# Patient Record
Sex: Female | Born: 1970
Health system: Southern US, Community
[De-identification: ages and names within clinical notes are randomized; demographics above are authoritative.]

## PROBLEM LIST (undated history)

## (undated) DIAGNOSIS — E079 Disorder of thyroid, unspecified: Secondary | ICD-10-CM

## (undated) DIAGNOSIS — N946 Dysmenorrhea, unspecified: Secondary | ICD-10-CM

## (undated) DIAGNOSIS — I10 Essential (primary) hypertension: Secondary | ICD-10-CM

## (undated) DIAGNOSIS — E785 Hyperlipidemia, unspecified: Secondary | ICD-10-CM

## (undated) HISTORY — PX: WISDOM TOOTH EXTRACTION: SHX21

## (undated) HISTORY — DX: Hyperlipidemia, unspecified: E78.5

## (undated) HISTORY — DX: Essential (primary) hypertension: I10

## (undated) HISTORY — DX: Disorder of thyroid, unspecified: E07.9

## (undated) HISTORY — DX: Dysmenorrhea, unspecified: N94.6

---

## 2013-02-21 ENCOUNTER — Ambulatory Visit: Payer: Self-pay | Admitting: Family Medicine

## 2013-03-04 ENCOUNTER — Ambulatory Visit (INDEPENDENT_AMBULATORY_CARE_PROVIDER_SITE_OTHER): Payer: Self-pay | Admitting: Family Medicine

## 2013-03-04 ENCOUNTER — Encounter: Payer: Self-pay | Admitting: Family Medicine

## 2013-03-04 VITALS — BP 120/80 | HR 81 | Temp 98.3°F | Ht <= 58 in | Wt 148.8 lb

## 2013-03-04 DIAGNOSIS — Z0000 Encounter for general adult medical examination without abnormal findings: Secondary | ICD-10-CM | POA: Insufficient documentation

## 2013-03-04 DIAGNOSIS — E785 Hyperlipidemia, unspecified: Secondary | ICD-10-CM

## 2013-03-04 DIAGNOSIS — E039 Hypothyroidism, unspecified: Secondary | ICD-10-CM | POA: Insufficient documentation

## 2013-03-04 MED ORDER — SIMVASTATIN 20 MG PO TABS: 20.0000 mg | ORAL_TABLET | Freq: Every day | ORAL | Status: DC

## 2013-03-04 MED ORDER — LEVOTHYROXINE SODIUM 100 MCG PO TABS: 100.0000 ug | ORAL_TABLET | Freq: Every day | ORAL | Status: DC

## 2013-03-04 NOTE — Assessment & Plan Note (Signed)
Pt's PE WNL.  UTD on GYN.  Check labs.  Anticipatory guidance provided.  

## 2013-03-04 NOTE — Assessment & Plan Note (Signed)
New to provider.  Check labs.  Adjust meds prn.

## 2013-03-04 NOTE — Assessment & Plan Note (Signed)
New to provider, chronic for pt.  Tolerating statin w/out difficulty.  Check labs.  Adjust meds prn  

## 2013-03-04 NOTE — Progress Notes (Signed)
  Subjective:    Patient ID: Rebekah Knox, female    DOB: Aug 16, 1971, 42 y.o.   MRN: [id number]  HPI New to establish.  Previous MD- none since moving from [place]  GYN- Marcelino Duster ([facility])  Difficult hx to obtain due to language barrier. Simvastatin 20 mg Levothyroxine   Review of Systems Patient reports no vision/ hearing changes, adenopathy,fever, weight change,  persistant/recurrent hoarseness , swallowing issues, chest pain, palpitations, edema, persistant/recurrent cough, hemoptysis, dyspnea (rest/exertional/paroxysmal nocturnal), gastrointestinal bleeding (melena, rectal bleeding), abdominal pain, significant heartburn, bowel changes, GU symptoms (dysuria, hematuria, incontinence), Gyn symptoms (abnormal  bleeding, pain),  syncope, focal weakness, memory loss, numbness & tingling, skin/hair/nail changes, abnormal bruising or bleeding, anxiety, or depression.     Objective:   Physical Exam General Appearance:    Alert, cooperative, no distress, appears stated age  Head:    Normocephalic, without obvious abnormality, atraumatic  Eyes:    PERRL, conjunctiva/corneas clear, EOM's intact, fundi    benign, both eyes  Ears:    Normal TM's and external ear canals, both ears  Nose:   Nares normal, septum midline, mucosa normal, no drainage    or sinus tenderness  Throat:   Lips, mucosa, and tongue normal; teeth and gums normal  Neck:   Supple, symmetrical, trachea midline, no adenopathy;    Thyroid: no enlargement/tenderness/nodules  Back:     Symmetric, no curvature, ROM normal, no CVA tenderness  Lungs:     Clear to auscultation bilaterally, respirations unlabored  Chest Wall:    No tenderness or deformity   Heart:    Regular rate and rhythm, S1 and S2 normal, no murmur, rub   or gallop  Breast Exam:    Deferred to GYN  Abdomen:     Soft, non-tender, bowel sounds active all four quadrants,    no masses, no organomegaly  Genitalia:    Deferred to GYN  Rectal:     Extremities:   Extremities normal, atraumatic, no cyanosis or edema  Pulses:   2+ and symmetric all extremities  Skin:   Skin color, texture, turgor normal, no rashes or lesions  Lymph nodes:   Cervical, supraclavicular, and axillary nodes normal  Neurologic:   CNII-XII intact, normal strength, sensation and reflexes    throughout          Assessment & Plan:

## 2013-03-04 NOTE — Patient Instructions (Addendum)
We'll notify you of your lab results and make any changes if needed Call with any questions or concerns Welcome!  We're glad to have you!

## 2013-03-05 LAB — HEPATIC FUNCTION PANEL
AST: 20 U/L (ref 0–37)
Albumin: 3.4 g/dL — ABNORMAL LOW (ref 3.5–5.2)
Alkaline Phosphatase: 53 U/L (ref 39–117)
Bilirubin, Direct: 0 mg/dL (ref 0.0–0.3)

## 2013-03-05 LAB — CBC WITH DIFFERENTIAL/PLATELET
Basophils Absolute: 0.1 10*3/uL (ref 0.0–0.1)
Eosinophils Relative: 0.5 % (ref 0.0–5.0)
Monocytes Absolute: 0.5 10*3/uL (ref 0.1–1.0)
Monocytes Relative: 7.6 % (ref 3.0–12.0)
Neutrophils Relative %: 54.8 % (ref 43.0–77.0)
Platelets: 261 10*3/uL (ref 150.0–400.0)
RDW: 16.8 % — ABNORMAL HIGH (ref 11.5–14.6)
WBC: 6 10*3/uL (ref 4.5–10.5)

## 2013-03-05 LAB — LIPID PANEL
HDL: 58.2 mg/dL (ref >39.00)
LDL Cholesterol: 90 mg/dL (ref 0–99)
Total CHOL/HDL Ratio: 3
Triglycerides: 93 mg/dL (ref 0.0–149.0)

## 2013-03-05 LAB — BASIC METABOLIC PANEL
CO2: 26 mEq/L (ref 19–32)
GFR: 88.21 mL/min (ref >60.00)
Glucose, Bld: 98 mg/dL (ref 70–99)
Potassium: 3.6 mEq/L (ref 3.5–5.1)
Sodium: 138 mEq/L (ref 135–145)

## 2013-03-07 ENCOUNTER — Encounter: Payer: Self-pay | Admitting: *Deleted

## 2013-03-20 ENCOUNTER — Telehealth: Payer: Self-pay | Admitting: Family Medicine

## 2013-03-20 NOTE — Telephone Encounter (Signed)
Patient states that she received her lab results in the mail but wants to know if she should be taking anything or what the next step in her plan of care is. Please advise.

## 2013-03-21 NOTE — Telephone Encounter (Signed)
Spoke with the pt and her husband and they want to know if she is to continue on the meds she is taking.   Informed them that the pt's labs look great and she not to change anything.  So she's to continue on all her meds.  The pt and her husband understood and agreed.//AB/CMA

## 2014-08-19 ENCOUNTER — Ambulatory Visit (INDEPENDENT_AMBULATORY_CARE_PROVIDER_SITE_OTHER): Payer: BC Managed Care – PPO | Admitting: Internal Medicine

## 2014-08-19 ENCOUNTER — Ambulatory Visit (INDEPENDENT_AMBULATORY_CARE_PROVIDER_SITE_OTHER): Payer: BC Managed Care – PPO

## 2014-08-19 ENCOUNTER — Encounter: Payer: Self-pay | Admitting: Internal Medicine

## 2014-08-19 VITALS — BP 130/88 | HR 72 | Temp 97.9°F | Resp 10 | Ht <= 58 in | Wt 148.0 lb

## 2014-08-19 DIAGNOSIS — Z23 Encounter for immunization: Secondary | ICD-10-CM

## 2014-08-19 DIAGNOSIS — E039 Hypothyroidism, unspecified: Secondary | ICD-10-CM

## 2014-08-19 DIAGNOSIS — E785 Hyperlipidemia, unspecified: Secondary | ICD-10-CM | POA: Insufficient documentation

## 2014-08-19 DIAGNOSIS — Z833 Family history of diabetes mellitus: Secondary | ICD-10-CM

## 2014-08-19 NOTE — Progress Notes (Signed)
Patient ID: Rebekah Knox, female   DOB: 26-Jan-1971, 43 y.o.   MRN: [id number]    Chief Complaint  Patient presents with  . Establish Care    New patient establish care: Fasting for any labs due,  . Vaginal Itching    Patient with vaginal itching prior to menstral cycle    Allergies  Allergen Reactions  . Ibuprofen Swelling   HPI 43 y/o female pt is here to establish care. She mentions having history of hypothyroidism and takes levothyroxine. She also has hyperlipidemia and is on lipitor. She is compliant with her medications. She complaints of occassional vaginal itching and discharge prior to her menses, recent completion of course of flagyl. No prior gyn records for review.  Review of Systems  Constitutional: Negative for fever, chills, weight loss, malaise/fatigue and diaphoresis.  HENT: Negative for congestion, hearing loss and sore throat.   Eyes: Negative for blurred vision, double vision and discharge. last eye exam 2 years back Respiratory: Negative for cough, sputum production, shortness of breath and wheezing.   Cardiovascular: Negative for chest pain, palpitations, orthopnea and leg swelling.  Gastrointestinal: Negative for heartburn, nausea, vomiting, abdominal pain, diarrhea and constipation.  Genitourinary: Negative for dysuria, urgency, frequency and flank pain. has vaginal itching with discharge prior to menstural cycle. Was seen by Gyn at [facility] 1 month back and had pap smear which was normal. Was treated for ? trichomonas Musculoskeletal: Negative for back pain, falls, joint pain and myalgias.  Skin: Negative for itching and rash. has dry skin Neurological: Negative for dizziness, tingling, focal weakness and headaches.  Psychiatric/Behavioral: Negative for depression and memory loss. The patient is not nervous/anxious.    Past Medical History  Diagnosis Date  . Thyroid disease   . Hyperlipidemia    No past surgical history on file. Current Outpatient  Prescriptions on File Prior to Visit  Medication Sig Dispense Refill  . levothyroxine (SYNTHROID, LEVOTHROID) 100 MCG tablet Take 1 tablet (100 mcg total) by mouth daily. 90 tablet 3  . simvastatin (ZOCOR) 20 MG tablet Take 1 tablet (20 mg total) by mouth at bedtime. 90 tablet 3   No current facility-administered medications on file prior to visit.    Family History  Problem Relation Age of Onset  . Cancer Mother     lung  . Heart attack Father    History   Social History  . Marital Status: Married    Spouse Name: N/A    Number of Children: N/A  . Years of Education: N/A   Occupational History  . Not on file.   Social History Main Topics  . Smoking status: Never Smoker   . Smokeless tobacco: Not on file  . Alcohol Use: No  . Drug Use: No  . Sexual Activity: Not on file   Other Topics Concern  . Not on file   Social History Narrative   Married since 1995   Lives in house, two stories, 6 persons live in home, no pets   No exercise       Physical exam BP 130/88 mmHg  Pulse 72  Temp(Src) 97.9 F (36.6 C) (Oral)  Resp 10  Ht 4' 8.5" (1.435 m)  Wt 148 lb (67.132 kg)  BMI 32.60 kg/m2  SpO2 98%  Wt Readings from Last 3 Encounters:  08/19/14 148 lb (67.132 kg)  03/04/13 148 lb 12.8 oz (67.495 kg)   General- adult female in no acute distress Head- atraumatic, normocephalic Eyes- PERRLA, EOMI, no pallor, no icterus,  no discharge Neck- no lymphadenopathy, no thyromegaly Mouth- normal mucus membrane Cardiovascular- normal s1,s2, no murmurs/ rubs/ gallops, normal distal pulses Respiratory- bilateral clear to auscultation, no wheeze, no rhonchi, no crackles Abdomen- bowel sounds present, soft, non tender, no CVA tenderness Musculoskeletal- able to move all 4 extremities, no leg edema Neurological- no focal deficit Skin- warm and dry Psychiatry- alert and oriented to person, place and time, normal mood and affect   Lab Results  Component Value Date   TSH 2.75  03/04/2013   Lipid Panel     Component Value Date/Time   CHOL 167 03/04/2013 1636   TRIG 93.0 03/04/2013 1636   HDL 58.20 03/04/2013 1636   CHOLHDL 3 03/04/2013 1636   VLDL 18.6 03/04/2013 1636   LDLCALC 90 03/04/2013 1636   CBC    Component Value Date/Time   WBC 6.0 03/04/2013 1636   RBC 4.70 03/04/2013 1636   HGB 12.2 03/04/2013 1636   HCT 36.8 03/04/2013 1636   PLT 261.0 03/04/2013 1636   MCV 78.4 03/04/2013 1636   MCHC 33.2 03/04/2013 1636   RDW 16.8* 03/04/2013 1636   LYMPHSABS 2.2 03/04/2013 1636   MONOABS 0.5 03/04/2013 1636   EOSABS 0.0 03/04/2013 1636   BASOSABS 0.1 03/04/2013 1636    CMP     Component Value Date/Time   NA 138 03/04/2013 1636   K 3.6 03/04/2013 1636   CL 108 03/04/2013 1636   CO2 26 03/04/2013 1636   GLUCOSE 98 03/04/2013 1636   BUN 7 03/04/2013 1636   CREATININE 0.8 03/04/2013 1636   CALCIUM 8.8 03/04/2013 1636   PROT 7.5 03/04/2013 1636   ALBUMIN 3.4* 03/04/2013 1636   AST 20 03/04/2013 1636   ALT 18 03/04/2013 1636   ALKPHOS 53 03/04/2013 1636   BILITOT 0.2* 03/04/2013 1636    Assessment/plan  1. Hypothyroidism, unspecified hypothyroidism type Continue levothyroxine 100 mcg daily for now, check thyroid panel and adjust dose if needed - CMP - CBC with Differential - TSH - T4, Free - T3  2. Hyperlipidemia Check lipid panel, continue lipitor current regimen, dietary counselling provided - Lipid Panel - CBC with Differential  3. Family history of diabetes mellitus in father Check a1c for diabetes. Diet and exercise counselling provided - CBC with Differential - Hemoglobin A1c   Influenza vaccine provided today Will need records from her gyn office for further review. Medical release form to be signed

## 2014-08-20 LAB — HEMOGLOBIN A1C
Est. average glucose Bld gHb Est-mCnc: 123 mg/dL
HEMOGLOBIN A1C: 5.9 % — AB (ref 4.8–5.6)

## 2014-08-20 LAB — COMPREHENSIVE METABOLIC PANEL
A/G RATIO: 1.3 (ref 1.1–2.5)
ALBUMIN: 3.9 g/dL (ref 3.5–5.5)
ALT: 23 IU/L (ref 0–32)
AST: 17 IU/L (ref 0–40)
Alkaline Phosphatase: 73 IU/L (ref 39–117)
BUN/Creatinine Ratio: 11 (ref 9–23)
BUN: 7 mg/dL (ref 6–24)
CALCIUM: 9.6 mg/dL (ref 8.7–10.2)
CO2: 23 mmol/L (ref 18–29)
CREATININE: 0.65 mg/dL (ref 0.57–1.00)
Chloride: 104 mmol/L (ref 97–108)
GFR calc Af Amer: 128 mL/min/{1.73_m2} (ref >59)
GFR, EST NON AFRICAN AMERICAN: 111 mL/min/{1.73_m2} (ref >59)
GLOBULIN, TOTAL: 3 g/dL (ref 1.5–4.5)
Glucose: 91 mg/dL (ref 65–99)
Potassium: 5.2 mmol/L (ref 3.5–5.2)
SODIUM: 139 mmol/L (ref 134–144)
TOTAL PROTEIN: 6.9 g/dL (ref 6.0–8.5)
Total Bilirubin: 0.4 mg/dL (ref 0.0–1.2)

## 2014-08-20 LAB — T3: T3, Total: 138 ng/dL (ref 71–180)

## 2014-08-20 LAB — CBC WITH DIFFERENTIAL/PLATELET
BASOS: 0 %
Basophils Absolute: 0 10*3/uL (ref 0.0–0.2)
EOS ABS: 0 10*3/uL (ref 0.0–0.4)
EOS: 1 %
HCT: 38.4 % (ref 34.0–46.6)
Hemoglobin: 13 g/dL (ref 11.1–15.9)
IMMATURE GRANS (ABS): 0 10*3/uL (ref 0.0–0.1)
IMMATURE GRANULOCYTES: 0 %
LYMPHS: 39 %
Lymphocytes Absolute: 1.9 10*3/uL (ref 0.7–3.1)
MCH: 27.1 pg (ref 26.6–33.0)
MCHC: 33.9 g/dL (ref 31.5–35.7)
MCV: 80 fL (ref 79–97)
MONOS ABS: 0.5 10*3/uL (ref 0.1–0.9)
Monocytes: 10 %
NEUTROS PCT: 50 %
Neutrophils Absolute: 2.5 10*3/uL (ref 1.4–7.0)
RBC: 4.8 x10E6/uL (ref 3.77–5.28)
RDW: 13.8 % (ref 12.3–15.4)
WBC: 4.9 10*3/uL (ref 3.4–10.8)

## 2014-08-20 LAB — LIPID PANEL
CHOL/HDL RATIO: 3.9 ratio (ref 0.0–4.4)
Cholesterol, Total: 238 mg/dL — ABNORMAL HIGH (ref 100–199)
HDL: 61 mg/dL (ref >39)
LDL Calculated: 148 mg/dL — ABNORMAL HIGH (ref 0–99)
Triglycerides: 143 mg/dL (ref 0–149)
VLDL Cholesterol Cal: 29 mg/dL (ref 5–40)

## 2014-08-20 LAB — T4, FREE: Free T4: 1.21 ng/dL (ref 0.82–1.77)

## 2014-08-20 LAB — TSH: TSH: 2.11 u[IU]/mL (ref 0.450–4.500)

## 2014-09-09 ENCOUNTER — Ambulatory Visit (INDEPENDENT_AMBULATORY_CARE_PROVIDER_SITE_OTHER): Payer: No Typology Code available for payment source | Admitting: Internal Medicine

## 2014-09-09 ENCOUNTER — Encounter: Payer: Self-pay | Admitting: Internal Medicine

## 2014-09-09 VITALS — BP 136/78 | HR 75 | Temp 98.2°F | Resp 18 | Ht <= 58 in | Wt 150.0 lb

## 2014-09-09 DIAGNOSIS — Z0000 Encounter for general adult medical examination without abnormal findings: Secondary | ICD-10-CM

## 2014-09-09 DIAGNOSIS — E039 Hypothyroidism, unspecified: Secondary | ICD-10-CM

## 2014-09-09 DIAGNOSIS — E785 Hyperlipidemia, unspecified: Secondary | ICD-10-CM

## 2014-09-09 DIAGNOSIS — R7309 Other abnormal glucose: Secondary | ICD-10-CM

## 2014-09-09 DIAGNOSIS — E669 Obesity, unspecified: Secondary | ICD-10-CM

## 2014-09-09 DIAGNOSIS — R7303 Prediabetes: Secondary | ICD-10-CM

## 2014-09-09 NOTE — Progress Notes (Signed)
Patient ID: Rebekah Knox, female   DOB: 12-11-1970, 44 y.o.   MRN: [id number]    Chief Complaint  Patient presents with  . Annual Exam   Allergies  Allergen Reactions  . Ibuprofen Swelling   HPI 44 y/o female pt is here for her annual exam.  Hypothyroidism- stable. taking levothyroxine. reviewed thyroid panel  Hyperlipidemia- has not been taking lipitor 20 mg daily for now, was taking it on as needed basis only, mentions she forgot to mentions it last visit. Reviewed recent lipid panel  Reviewed her other labs.  She complaints of occassional vaginal itching- says improved than before but persists. Has seen Gyn before. No prior gyn records yet for review.recent pap smear and pelvic exam by her gyn  Has sedentary lifestyle.    Review of Systems  Constitutional: Negative for fever, chills, weight loss, malaise/fatigue and diaphoresis.  HENT: Negative for congestion, hearing loss and sore throat.   Eyes: Negative for blurred vision, double vision and discharge. last eye exam 2 years back Respiratory: Negative for cough, sputum production, shortness of breath and wheezing.   Cardiovascular: Negative for chest pain, palpitations, orthopnea and leg swelling.  Gastrointestinal: Negative for heartburn, nausea, vomiting, abdominal pain, diarrhea and constipation.  Genitourinary: Negative for dysuria, urgency, frequency and flank pain.  Musculoskeletal: Negative for back pain, falls, joint pain and myalgias.  Skin: Negative for itching and rash. has dry skin Neurological: Negative for dizziness, tingling, focal weakness and headaches.  Psychiatric/Behavioral: Negative for depression and memory loss. The patient is not nervous/anxious.   Immunization History  Administered Date(s) Administered  . Influenza,inj,Quad PF,36+ Mos 08/19/2014   Past Medical History  Diagnosis Date  . Thyroid disease   . Hyperlipidemia    History reviewed. No pertinent past surgical history.  Family  History  Problem Relation Age of Onset  . Cancer Mother     lung  . Heart attack Father    History   Social History  . Marital Status: Married    Spouse Name: N/A    Number of Children: N/A  . Years of Education: N/A   Occupational History  . Not on file.   Social History Main Topics  . Smoking status: Never Smoker   . Smokeless tobacco: Not on file  . Alcohol Use: No  . Drug Use: No  . Sexual Activity: Not on file   Other Topics Concern  . Not on file   Social History Narrative   Married since 1995   Lives in house, two stories, 6 persons live in home, no pets   No exercise       Physical exam BP 136/78 mmHg  Pulse 75  Temp(Src) 98.2 F (36.8 C) (Oral)  Resp 18  Ht 4' 8.5" (1.435 m)  Wt 150 lb (68.04 kg)  BMI 33.04 kg/m2  SpO2 98%  LMP 09/01/2014 (Approximate)  Wt Readings from Last 3 Encounters:  09/09/14 150 lb (68.04 kg)  08/19/14 148 lb (67.132 kg)  03/04/13 148 lb 12.8 oz (67.495 kg)   General- adult female in no acute distress, obese Head- atraumatic, normocephalic Eyes- PERRLA, EOMI, no pallor, no icterus, no discharge Ears- left ear normal tympanic membrane and normal external ear canal , right ear normal tympanic membrane and normal external ear canal Neck- no lymphadenopathy, no thyromegaly, no jugular vein distension, no carotid bruit Nose- normal nasal mucosa, no maxillary sinus tenderness, no frontal sinus tenderness Mouth- normal mucus membrane, no oral thrush, normal oropharynx Chest- no chest wall deformities,  no chest wall tenderness Breast- no masses, no palpable lumps, normal nipple and areola exam, no axillary lymphadenopathy Cardiovascular- normal s1,s2, no murmurs/ rubs/ gallops, normal distal pulses Respiratory- bilateral clear to auscultation, no wheeze, no rhonchi, no crackles Abdomen- bowel sounds present, soft, non tender, no organomegaly, no abdominal bruits, no guarding or rigidity, no CVA tenderness Pelvic exam-  deferred Musculoskeletal- able to move all 4 extremities, no spinal and paraspinal tenderness, steady gait, no use of assistive device, normal range of motion, no leg edema Neurological- no focal deficit, normal reflexes, normal muscle strength, normal sensation to fine touch and vibration Skin- warm and dry Psychiatry- alert and oriented to person, place and time, normal mood and affect   Labs CBC Latest Ref Rng 08/19/2014 03/04/2013  WBC 3.4 - 10.8 x10E3/uL 4.9 6.0  Hemoglobin 11.1 - 15.9 g/dL 13.0 12.2  Hematocrit 34.0 - 46.6 % 38.4 36.8  Platelets 150.0 - 400.0 K/uL - 261.0    CMP Latest Ref Rng 08/19/2014 03/04/2013  Glucose 65 - 99 mg/dL 91 98  BUN 6 - 24 mg/dL 7 7  Creatinine 0.57 - 1.00 mg/dL 0.65 0.8  Sodium 134 - 144 mmol/L 139 138  Potassium 3.5 - 5.2 mmol/L 5.2 3.6  Chloride 97 - 108 mmol/L 104 108  CO2 18 - 29 mmol/L 23 26  Calcium 8.7 - 10.2 mg/dL 9.6 8.8  Total Protein 6.0 - 8.5 g/dL 6.9 7.5  Albumin 3.5 - 5.5 g/dL 3.9 -  Total Bilirubin 0.0 - 1.2 mg/dL 0.4 0.2(L)  Alkaline Phos 39 - 117 IU/L 73 53  AST 0 - 40 IU/L 17 20  ALT 0 - 32 IU/L 23 18    Lab Results  Component Value Date   HGBA1C 5.9* 08/19/2014   Lipid Panel     Component Value Date/Time   CHOL 167 03/04/2013 1636   TRIG 143 08/19/2014 1122   HDL 61 08/19/2014 1122   HDL 58.20 03/04/2013 1636   CHOLHDL 3.9 08/19/2014 1122   CHOLHDL 3 03/04/2013 1636   VLDL 18.6 03/04/2013 1636   LDLCALC 148* 08/19/2014 1122   LDLCALC 90 03/04/2013 1636   Lab Results  Component Value Date   TSH 2.110 08/19/2014    Assessment/plan  1. Prediabetes Has family history of diabetes, has high LDL and is obese by BMI and a1c is suggestive of prediabetes. counselled on dietary changes and exercise for atleast 30 min 5 days a week. Restarted on statin.  - Hemoglobin A1c; Future  2. Hyperlipidemia Resume zocor and monitor lipid panel - Lipid Panel; Future  3. Hypothyroidism, unspecified hypothyroidism  type Continue levothyroxine 100 mcg daily - TSH; Future  4. Routine physical exam the patient was counseled regarding the appropriate use of alcohol, regular self-examination of the breasts on a monthly basis, prevention of dental and periodontal disease, diet, regular sustained exercise for at least 30 minutes 5 times per week, routine screening interval for mammogram as recommended by the American Cancer Society and USPSTF, the proper use of sunscreen and protective clothing with cholesterol, thyroid and diabetes screening. uptodate with influenza vaccine.  5. Obesity (BMI 30.0-34.9) Advised on weight loss, explained benefits of it and complications that would arise from obesity. Calorie counting and eating healthy and daily exercise encouraged.

## 2014-09-12 DIAGNOSIS — Z0000 Encounter for general adult medical examination without abnormal findings: Secondary | ICD-10-CM | POA: Insufficient documentation

## 2014-09-12 DIAGNOSIS — E039 Hypothyroidism, unspecified: Secondary | ICD-10-CM | POA: Insufficient documentation

## 2014-09-12 DIAGNOSIS — E669 Obesity, unspecified: Secondary | ICD-10-CM | POA: Insufficient documentation

## 2014-09-12 DIAGNOSIS — R7303 Prediabetes: Secondary | ICD-10-CM | POA: Insufficient documentation

## 2014-10-14 ENCOUNTER — Encounter: Payer: Self-pay | Admitting: Internal Medicine

## 2015-01-26 ENCOUNTER — Encounter: Payer: Self-pay | Admitting: Internal Medicine

## 2015-02-10 ENCOUNTER — Telehealth: Payer: Self-pay | Admitting: Behavioral Health

## 2015-02-10 ENCOUNTER — Encounter: Payer: Self-pay | Admitting: Behavioral Health

## 2015-02-10 NOTE — Telephone Encounter (Signed)
Pre-Visit Call completed with patient and chart updated.   Pre-Visit Info documented in Specialty Comments under [employer].    

## 2015-02-11 ENCOUNTER — Encounter: Payer: Self-pay | Admitting: Physician Assistant

## 2015-02-11 ENCOUNTER — Ambulatory Visit (HOSPITAL_BASED_OUTPATIENT_CLINIC_OR_DEPARTMENT_OTHER)
Admission: RE | Admit: 2015-02-11 | Discharge: 2015-02-11 | Disposition: A | Discharge status: Home / Self Care | Payer: No Typology Code available for payment source | Source: Ambulatory Visit | Attending: Physician Assistant | Admitting: Physician Assistant

## 2015-02-11 ENCOUNTER — Telehealth: Payer: Self-pay

## 2015-02-11 ENCOUNTER — Ambulatory Visit (INDEPENDENT_AMBULATORY_CARE_PROVIDER_SITE_OTHER): Payer: No Typology Code available for payment source | Admitting: Physician Assistant

## 2015-02-11 VITALS — BP 122/70 | HR 72 | Temp 97.8°F | Resp 18 | Ht 58.5 in | Wt 148.8 lb

## 2015-02-11 DIAGNOSIS — G8929 Other chronic pain: Secondary | ICD-10-CM

## 2015-02-11 DIAGNOSIS — L309 Dermatitis, unspecified: Secondary | ICD-10-CM | POA: Insufficient documentation

## 2015-02-11 DIAGNOSIS — Z418 Encounter for other procedures for purposes other than remedying health state: Secondary | ICD-10-CM

## 2015-02-11 DIAGNOSIS — M25532 Pain in left wrist: Secondary | ICD-10-CM | POA: Insufficient documentation

## 2015-02-11 DIAGNOSIS — Z1239 Encounter for other screening for malignant neoplasm of breast: Secondary | ICD-10-CM | POA: Insufficient documentation

## 2015-02-11 DIAGNOSIS — M722 Plantar fascial fibromatosis: Secondary | ICD-10-CM | POA: Insufficient documentation

## 2015-02-11 DIAGNOSIS — E039 Hypothyroidism, unspecified: Secondary | ICD-10-CM

## 2015-02-11 DIAGNOSIS — M25531 Pain in right wrist: Secondary | ICD-10-CM

## 2015-02-11 DIAGNOSIS — M25539 Pain in unspecified wrist: Secondary | ICD-10-CM

## 2015-02-11 DIAGNOSIS — E785 Hyperlipidemia, unspecified: Secondary | ICD-10-CM | POA: Diagnosis not present

## 2015-02-11 DIAGNOSIS — Z299 Encounter for prophylactic measures, unspecified: Secondary | ICD-10-CM

## 2015-02-11 LAB — CBC
HCT: 39.1 % (ref 36.0–46.0)
HEMOGLOBIN: 12.8 g/dL (ref 12.0–15.0)
MCHC: 32.6 g/dL (ref 30.0–36.0)
MCV: 78.1 fl (ref 78.0–100.0)
Platelets: 276 10*3/uL (ref 150.0–400.0)
RBC: 5.01 Mil/uL (ref 3.87–5.11)
RDW: 14.8 % (ref 11.5–15.5)
WBC: 4.9 10*3/uL (ref 4.0–10.5)

## 2015-02-11 LAB — BASIC METABOLIC PANEL
BUN: 8 mg/dL (ref 6–23)
CALCIUM: 9 mg/dL (ref 8.4–10.5)
CO2: 22 mEq/L (ref 19–32)
CREATININE: 0.7 mg/dL (ref 0.40–1.20)
Chloride: 105 mEq/L (ref 96–112)
GFR: 97.52 mL/min (ref >60.00)
GLUCOSE: 89 mg/dL (ref 70–99)
Potassium: 3.7 mEq/L (ref 3.5–5.1)
Sodium: 136 mEq/L (ref 135–145)

## 2015-02-11 LAB — TSH: TSH: 2.95 u[IU]/mL (ref 0.35–4.50)

## 2015-02-11 LAB — LIPID PANEL
CHOL/HDL RATIO: 3
Cholesterol: 194 mg/dL (ref 0–200)
HDL: 59 mg/dL (ref >39.00)
LDL Cholesterol: 116 mg/dL — ABNORMAL HIGH (ref 0–99)
NonHDL: 135
Triglycerides: 97 mg/dL (ref 0.0–149.0)
VLDL: 19.4 mg/dL (ref 0.0–40.0)

## 2015-02-11 LAB — URINALYSIS, ROUTINE W REFLEX MICROSCOPIC
BILIRUBIN URINE: NEGATIVE
Hgb urine dipstick: NEGATIVE
Ketones, ur: NEGATIVE
Nitrite: NEGATIVE
PH: 6 (ref 5.0–8.0)
RBC / HPF: NONE SEEN (ref 0–?)
Specific Gravity, Urine: <=1.005 — AB (ref 1.000–1.030)
Total Protein, Urine: NEGATIVE
Urine Glucose: NEGATIVE
Urobilinogen, UA: 0.2 (ref 0.0–1.0)

## 2015-02-11 MED ORDER — SIMVASTATIN 20 MG PO TABS: 20.0000 mg | ORAL_TABLET | Freq: Every day | ORAL | Status: DC

## 2015-02-11 MED ORDER — LEVOTHYROXINE SODIUM 100 MCG PO TABS: 100.0000 ug | ORAL_TABLET | Freq: Every day | ORAL | Status: DC

## 2015-02-11 MED ORDER — TRIAMCINOLONE 0.1 % CREAM:EUCERIN CREAM 1:1
1.0000 | TOPICAL_CREAM | Freq: Two times a day (BID) | CUTANEOUS | Status: DC | PRN
Start: 2015-02-11 — End: 2015-02-17

## 2015-02-11 NOTE — Assessment & Plan Note (Signed)
Allergic to NSAIDs. Tylenol ES daily. Supportive foot wear.  Compression encouraged and ACE wrap given to patient. "cold can" exercises.  Follow-up if not improving.

## 2015-02-11 NOTE — Patient Instructions (Signed)
Please continue medications as directed.  Start the Kenalog: Eucerin cream twice daily for dryness and itching.  Continue allergy medication.  For the Plantar fasciitis, please take 2 ES tylenol as needed for pain.  Wear supportive footwear.  USe the ACE wrap to add compression.  Do the "cold can" exercises we discussed.  Read information below on this.  Go to lab for blood work.  Then go downstairs for imaging.  I will call you with your results.  Follow-up will be based on your results.  Plantar Fasciitis (Heel Spur Syndrome) with Rehab The plantar fascia is a fibrous, ligament-like, soft-tissue structure that spans the bottom of the foot. Plantar fasciitis is a condition that causes pain in the foot due to inflammation of the tissue. SYMPTOMS   Pain and tenderness on the underneath side of the foot.  Pain that worsens with standing or walking. CAUSES  Plantar fasciitis is caused by irritation and injury to the plantar fascia on the underneath side of the foot. Common mechanisms of injury include:  Direct trauma to bottom of the foot.  Damage to a small nerve that runs under the foot where the main fascia attaches to the heel bone.  Stress placed on the plantar fascia due to bone spurs. RISK INCREASES WITH:   Activities that place stress on the plantar fascia (running, jumping, pivoting, or cutting).  Poor strength and flexibility.  Improperly fitted shoes.  Tight calf muscles.  Flat feet.  Failure to warm-up properly before activity.  Obesity. PREVENTION  Warm up and stretch properly before activity.  Allow for adequate recovery between workouts.  Maintain physical fitness:  Strength, flexibility, and endurance.  Cardiovascular fitness.  Maintain a health body weight.  Avoid stress on the plantar fascia.  Wear properly fitted shoes, including arch supports for individuals who have flat feet. PROGNOSIS  If treated properly, then the symptoms of plantar  fasciitis usually resolve without surgery. However, occasionally surgery is necessary. RELATED COMPLICATIONS   Recurrent symptoms that may result in a chronic condition.  Problems of the lower back that are caused by compensating for the injury, such as limping.  Pain or weakness of the foot during push-off following surgery.  Chronic inflammation, scarring, and partial or complete fascia tear, occurring more often from repeated injections. TREATMENT  Treatment initially involves the use of ice and medication to help reduce pain and inflammation. The use of strengthening and stretching exercises may help reduce pain with activity, especially stretches of the Achilles tendon. These exercises may be performed at home or with a therapist. Your caregiver may recommend that you use heel cups of arch supports to help reduce stress on the plantar fascia. Occasionally, corticosteroid injections are given to reduce inflammation. If symptoms persist for greater than 6 months despite non-surgical (conservative), then surgery may be recommended.  MEDICATION   If pain medication is necessary, then nonsteroidal anti-inflammatory medications, such as aspirin and ibuprofen, or other minor pain relievers, such as acetaminophen, are often recommended.  Do not take pain medication within 7 days before surgery.  Prescription pain relievers may be given if deemed necessary by your caregiver. Use only as directed and only as much as you need.  Corticosteroid injections may be given by your caregiver. These injections should be reserved for the most serious cases, because they may only be given a certain number of times. HEAT AND COLD  Cold treatment (icing) relieves pain and reduces inflammation. Cold treatment should be applied for 10 to 15 minutes  every 2 to 3 hours for inflammation and pain and immediately after any activity that aggravates your symptoms. Use ice packs or massage the area with a piece of ice  (ice massage).  Heat treatment may be used prior to performing the stretching and strengthening activities prescribed by your caregiver, physical therapist, or athletic trainer. Use a heat pack or soak the injury in warm water. SEEK IMMEDIATE MEDICAL CARE IF:  Treatment seems to offer no benefit, or the condition worsens.  Any medications produce adverse side effects. EXERCISES RANGE OF MOTION (ROM) AND STRETCHING EXERCISES - Plantar Fasciitis (Heel Spur Syndrome) These exercises may help you when beginning to rehabilitate your injury. Your symptoms may resolve with or without further involvement from your physician, physical therapist or athletic trainer. While completing these exercises, remember:   Restoring tissue flexibility helps normal motion to return to the joints. This allows healthier, less painful movement and activity.  An effective stretch should be held for at least 30 seconds.  A stretch should never be painful. You should only feel a gentle lengthening or release in the stretched tissue. RANGE OF MOTION - Toe Extension, Flexion  Sit with your right / left leg crossed over your opposite knee.  Grasp your toes and gently pull them back toward the top of your foot. You should feel a stretch on the bottom of your toes and/or foot.  Hold this stretch for __________ seconds.  Now, gently pull your toes toward the bottom of your foot. You should feel a stretch on the top of your toes and or foot.  Hold this stretch for __________ seconds. Repeat __________ times. Complete this stretch __________ times per day.  RANGE OF MOTION - Ankle Dorsiflexion, Active Assisted  Remove shoes and sit on a chair that is preferably not on a carpeted surface.  Place right / left foot under knee. Extend your opposite leg for support.  Keeping your heel down, slide your right / left foot back toward the chair until you feel a stretch at your ankle or calf. If you do not feel a stretch, slide  your bottom forward to the edge of the chair, while still keeping your heel down.  Hold this stretch for __________ seconds. Repeat __________ times. Complete this stretch __________ times per day.  STRETCH - Gastroc, Standing  Place hands on wall.  Extend right / left leg, keeping the front knee somewhat bent.  Slightly point your toes inward on your back foot.  Keeping your right / left heel on the floor and your knee straight, shift your weight toward the wall, not allowing your back to arch.  You should feel a gentle stretch in the right / left calf. Hold this position for __________ seconds. Repeat __________ times. Complete this stretch __________ times per day. STRETCH - Soleus, Standing  Place hands on wall.  Extend right / left leg, keeping the other knee somewhat bent.  Slightly point your toes inward on your back foot.  Keep your right / left heel on the floor, bend your back knee, and slightly shift your weight over the back leg so that you feel a gentle stretch deep in your back calf.  Hold this position for __________ seconds. Repeat __________ times. Complete this stretch __________ times per day. STRETCH - Gastrocsoleus, Standing  Note: This exercise can place a lot of stress on your foot and ankle. Please complete this exercise only if specifically instructed by your caregiver.   Place the ball of your  right / left foot on a step, keeping your other foot firmly on the same step.  Hold on to the wall or a rail for balance.  Slowly lift your other foot, allowing your body weight to press your heel down over the edge of the step.  You should feel a stretch in your right / left calf.  Hold this position for __________ seconds.  Repeat this exercise with a slight bend in your right / left knee. Repeat __________ times. Complete this stretch __________ times per day.  STRENGTHENING EXERCISES - Plantar Fasciitis (Heel Spur Syndrome)  These exercises may help you  when beginning to rehabilitate your injury. They may resolve your symptoms with or without further involvement from your physician, physical therapist or athletic trainer. While completing these exercises, remember:   Muscles can gain both the endurance and the strength needed for everyday activities through controlled exercises.  Complete these exercises as instructed by your physician, physical therapist or athletic trainer. Progress the resistance and repetitions only as guided. STRENGTH - Towel Curls  Sit in a chair positioned on a non-carpeted surface.  Place your foot on a towel, keeping your heel on the floor.  Pull the towel toward your heel by only curling your toes. Keep your heel on the floor.  If instructed by your physician, physical therapist or athletic trainer, add ____________________ at the end of the towel. Repeat __________ times. Complete this exercise __________ times per day. STRENGTH - Ankle Inversion  Secure one end of a rubber exercise band/tubing to a fixed object (table, pole). Loop the other end around your foot just before your toes.  Place your fists between your knees. This will focus your strengthening at your ankle.  Slowly, pull your big toe up and in, making sure the band/tubing is positioned to resist the entire motion.  Hold this position for __________ seconds.  Have your muscles resist the band/tubing as it slowly pulls your foot back to the starting position. Repeat __________ times. Complete this exercises __________ times per day.  Document Released: 08/08/2005 Document Revised: 10/31/2011 Document Reviewed: 11/20/2008 [facility] Patient Information 2015 [place], [place]. This information is not intended to replace advice given to you by your health care provider. Make sure you discuss any questions you have with your health care provider.

## 2015-02-11 NOTE — Assessment & Plan Note (Signed)
Hydration encouraged.  Rx Kenalog: Eucerin cream to apply BID.  Follow-up if not resolving.

## 2015-02-11 NOTE — Assessment & Plan Note (Signed)
Previously well-controlled.  Will repeat TSH today.

## 2015-02-11 NOTE — Assessment & Plan Note (Signed)
Order for screening mammogram placed.  

## 2015-02-11 NOTE — Telephone Encounter (Signed)
-----   Message from Brunetta Jeans, PA-C sent at 02/11/2015 10:44 AM EDT ----- X-ray confirms suspiscion of previous wrist fracture.  Old scaphoid bone fracture noted. Pain is likely arthritis due to prior trauma.  Apply [facility] or Aspercreme to area for the infrequent pain. Follow-up if symptoms worsen.

## 2015-02-11 NOTE — Assessment & Plan Note (Signed)
On statin daily without myalgias.  Will repeat lipid panel today.

## 2015-02-11 NOTE — Progress Notes (Signed)
Patient presents to clinic today to establish care.  Acute Concerns: Patient complains of R heel pain over the last month that is constant but worse in the morning and with prolonged standing.  Patient denies swelling, numbness, or tingling of extremity.    Chronic Issues: Hypothyroidism -- Patient currently on levothyroxine 100 mcg daily. Endorses previously well controlled. Notes dryness of skin with itching, worse on arms bilaterally. Allergy tablets   Hyperlipidemia -- Currently on Simvastatin 20 mg daily.  Denies myalgias with medications. Is requesting  Health Maintenance Mammogram -- Overdue.  Past Medical History  Diagnosis Date  . Thyroid disease   . Hyperlipidemia     History reviewed. No pertinent past surgical history.  No current outpatient prescriptions on file prior to visit.   No current facility-administered medications on file prior to visit.    Allergies  Allergen Reactions  . Ibuprofen Swelling    Family History  Problem Relation Age of Onset  . Cancer Mother     lung  . Heart attack Father     History   Social History  . Marital Status: Married    Spouse Name: N/A  . Number of Children: N/A  . Years of Education: N/A   Occupational History  . Not on file.   Social History Main Topics  . Smoking status: Never Smoker   . Smokeless tobacco: Never Used  . Alcohol Use: No  . Drug Use: No  . Sexual Activity:    Partners: Male   Other Topics Concern  . Not on file   Social History Narrative   Married since 1995   Lives in house, two stories, 6 persons live in home, no pets   No exercise       Review of Systems  Constitutional: Negative for fever and chills.  Respiratory: Negative for sputum production.   Cardiovascular: Negative for chest pain and palpitations.  Musculoskeletal: Positive for joint pain. Negative for falls.  Skin: Positive for itching.  Endo/Heme/Allergies: Positive for environmental allergies.    Psychiatric/Behavioral: Negative for depression. The patient does not have insomnia.    BP 122/70 mmHg  Pulse 72  Temp(Src) 97.8 F (36.6 C) (Oral)  Resp 18  Ht 4' 10.5" (1.486 m)  Wt 148 lb 12.8 oz (67.495 kg)  BMI 30.57 kg/m2  SpO2 99%  LMP 01/14/2015  Physical Exam  Constitutional: She is oriented to person, place, and time and well-developed, well-nourished, and in no distress.  HENT:  Head: Normocephalic and atraumatic.  Right Ear: External ear normal.  Left Ear: External ear normal.  Nose: Nose normal.  Mouth/Throat: Oropharynx is clear and moist. No oropharyngeal exudate.  Eyes: Conjunctivae and EOM are normal. Pupils are equal, round, and reactive to light.  Neck: Neck supple. No thyromegaly present.  Cardiovascular: Normal rate, regular rhythm, normal heart sounds and intact distal pulses.   Pulmonary/Chest: Effort normal and breath sounds normal. No respiratory distress. She has no wheezes. She has no rales. She exhibits no tenderness.  Musculoskeletal:       Right wrist: She exhibits deformity. She exhibits normal range of motion and no tenderness.       Feet:  Neurological: She is alert and oriented to person, place, and time.  Skin: Skin is warm and dry.     Psychiatric: Affect normal.  Vitals reviewed.  Assessment/Plan: Hypothyroidism Previously well-controlled.  Will repeat TSH today.  Hyperlipidemia On statin daily without myalgias.  Will repeat lipid panel today.  Plantar fasciitis of  right foot Allergic to NSAIDs. Tylenol ES daily. Supportive foot wear.  Compression encouraged and ACE wrap given to patient. "cold can" exercises.  Follow-up if not improving.  Eczema Hydration encouraged.  Rx Kenalog: Eucerin cream to apply BID.  Follow-up if not resolving.  Chronic wrist pain With noted gross deformity around scaphoid.  No recent trauma or injury.  Will obtain x-ray to further assess.  Suspect old poorly aligned fracture with mild arthritic  changes.  Supportive measures and OTC medications discussed.  Will call with x-ray results.  Breast cancer screening Order for screening mammogram placed.

## 2015-02-11 NOTE — Assessment & Plan Note (Signed)
With noted gross deformity around scaphoid.  No recent trauma or injury.  Will obtain x-ray to further assess.  Suspect old poorly aligned fracture with mild arthritic changes.  Supportive measures and OTC medications discussed.  Will call with x-ray results.

## 2015-02-11 NOTE — Progress Notes (Signed)
Pre visit review using our clinic review tool, if applicable. No additional management support is needed unless otherwise documented below in the visit note. 

## 2015-02-12 NOTE — Telephone Encounter (Signed)
Pt notified of results. No questions at this time.

## 2015-02-17 ENCOUNTER — Ambulatory Visit (INDEPENDENT_AMBULATORY_CARE_PROVIDER_SITE_OTHER): Payer: No Typology Code available for payment source | Admitting: Physician Assistant

## 2015-02-17 ENCOUNTER — Ambulatory Visit (HOSPITAL_BASED_OUTPATIENT_CLINIC_OR_DEPARTMENT_OTHER)
Admission: RE | Admit: 2015-02-17 | Discharge: 2015-02-17 | Disposition: A | Discharge status: Home / Self Care | Payer: No Typology Code available for payment source | Source: Ambulatory Visit | Attending: Physician Assistant | Admitting: Physician Assistant

## 2015-02-17 ENCOUNTER — Encounter: Payer: Self-pay | Admitting: Physician Assistant

## 2015-02-17 VITALS — BP 134/93 | HR 75 | Temp 98.2°F | Ht 58.5 in | Wt 150.8 lb

## 2015-02-17 DIAGNOSIS — G8929 Other chronic pain: Secondary | ICD-10-CM | POA: Diagnosis not present

## 2015-02-17 DIAGNOSIS — Z1231 Encounter for screening mammogram for malignant neoplasm of breast: Secondary | ICD-10-CM | POA: Diagnosis not present

## 2015-02-17 DIAGNOSIS — L309 Dermatitis, unspecified: Secondary | ICD-10-CM | POA: Diagnosis not present

## 2015-02-17 DIAGNOSIS — M25531 Pain in right wrist: Secondary | ICD-10-CM | POA: Diagnosis not present

## 2015-02-17 DIAGNOSIS — Z1239 Encounter for other screening for malignant neoplasm of breast: Secondary | ICD-10-CM

## 2015-02-17 MED ORDER — TRIAMCINOLONE 0.1 % CREAM:EUCERIN CREAM 1:1: 1.0000 "application " | TOPICAL_CREAM | Freq: Two times a day (BID) | CUTANEOUS | Status: DC | PRN

## 2015-02-17 NOTE — Assessment & Plan Note (Signed)
Referral to hand surgeon placed.  Recent x-rays results giving. Patient sent to Imaging department to pick up CD of x-ray films.

## 2015-02-17 NOTE — Progress Notes (Signed)
Patient presents to clinic today to discuss referral to hand surgeon regarding chronic wrist pain secondary to old poorly-healed scaphoid fracture. Patient has appointment later today and needs imaging results and referral.  Past Medical History  Diagnosis Date  . Thyroid disease   . Hyperlipidemia     Current Outpatient Prescriptions on File Prior to Visit  Medication Sig Dispense Refill  . levothyroxine (SYNTHROID, LEVOTHROID) 100 MCG tablet Take 1 tablet (100 mcg total) by mouth daily. 90 tablet 1  . simvastatin (ZOCOR) 20 MG tablet Take 1 tablet (20 mg total) by mouth at bedtime. 90 tablet 1   No current facility-administered medications on file prior to visit.    Allergies  Allergen Reactions  . Ibuprofen Swelling    Family History  Problem Relation Age of Onset  . Cancer Mother     lung  . Heart attack Father     History   Social History  . Marital Status: Married    Spouse Name: N/A  . Number of Children: N/A  . Years of Education: N/A   Social History Main Topics  . Smoking status: Never Smoker   . Smokeless tobacco: Never Used  . Alcohol Use: No  . Drug Use: No  . Sexual Activity:    Partners: Male   Other Topics Concern  . None   Social History Narrative   Married since 1995   Lives in house, two stories, 6 persons live in home, no pets   No exercise        Review of Systems - See HPI.  All other ROS are negative.  BP 134/93 mmHg  Pulse 75  Temp(Src) 98.2 F (36.8 C) (Oral)  Ht 4' 10.5" (1.486 m)  Wt 150 lb 12.8 oz (68.402 kg)  BMI 30.98 kg/m2  SpO2 99%  LMP 02/16/2015  Physical Exam  Constitutional: She is well-developed, well-nourished, and in no distress.  HENT:  Head: Normocephalic and atraumatic.  Cardiovascular: Normal rate and regular rhythm.   Pulmonary/Chest: Effort normal and breath sounds normal.  Vitals reviewed.   Recent Results (from the past 2160 hour(s))  Urinalysis, Routine w reflex microscopic     Status:  Abnormal   Collection Time: 02/11/15  9:47 AM  Result Value Ref Range   Color, Urine YELLOW Yellow;Lt. Yellow   APPearance CLEAR Clear   Specific Gravity, Urine <=1.005 (A) 1.000 - 1.030   pH 6.0 5.0 - 8.0   Total Protein, Urine NEGATIVE Negative   Urine Glucose NEGATIVE Negative   Ketones, ur NEGATIVE Negative   Bilirubin Urine NEGATIVE Negative   Hgb urine dipstick NEGATIVE Negative   Urobilinogen, UA 0.2 0.0 - 1.0   Leukocytes, UA TRACE (A) Negative   Nitrite NEGATIVE Negative   WBC, UA 0-2/hpf 0-2/hpf   RBC / HPF none seen 0-2/hpf   Squamous Epithelial / LPF Rare(0-4/hpf) Rare(0-4/hpf)  CBC     Status: None   Collection Time: 02/11/15  9:47 AM  Result Value Ref Range   WBC 4.9 4.0 - 10.5 K/uL   RBC 5.01 3.87 - 5.11 Mil/uL   Platelets 276.0 150.0 - 400.0 K/uL   Hemoglobin 12.8 12.0 - 15.0 g/dL   HCT 39.1 36.0 - 46.0 %   MCV 78.1 78.0 - 100.0 fl   MCHC 32.6 30.0 - 36.0 g/dL   RDW 14.8 11.5 - 74.9 %  Basic metabolic panel     Status: None   Collection Time: 02/11/15  9:47 AM  Result Value Ref Range  Sodium 136 135 - 145 mEq/L   Potassium 3.7 3.5 - 5.1 mEq/L   Chloride 105 96 - 112 mEq/L   CO2 22 19 - 32 mEq/L   Glucose, Bld 89 70 - 99 mg/dL   BUN 8 6 - 23 mg/dL   Creatinine, Ser 0.70 0.40 - 1.20 mg/dL   Calcium 9.0 8.4 - 10.5 mg/dL   GFR 97.52 >60.00 mL/min  TSH     Status: None   Collection Time: 02/11/15  9:47 AM  Result Value Ref Range   TSH 2.95 0.35 - 4.50 uIU/mL  Lipid panel     Status: Abnormal   Collection Time: 02/11/15  9:47 AM  Result Value Ref Range   Cholesterol 194 0 - 200 mg/dL    Comment: ATP III Classification       Desirable:  < 200 mg/dL               Borderline High:  200 - 239 mg/dL          High:  > = 240 mg/dL   Triglycerides 97.0 0.0 - 149.0 mg/dL    Comment: Normal:  <150 mg/dLBorderline High:  150 - 199 mg/dL   HDL 59.00 >39.00 mg/dL   VLDL 19.4 0.0 - 40.0 mg/dL   LDL Cholesterol 116 (H) 0 - 99 mg/dL   Total CHOL/HDL Ratio 3      Comment:                Men          Women1/2 Average Risk     3.4          3.3Average Risk          5.0          4.42X Average Risk          9.6          7.13X Average Risk          15.0          11.0                       NonHDL 135.00     Comment: NOTE:  Non-HDL goal should be 30 mg/dL higher than patient's LDL goal (i.e. LDL goal of < 70 mg/dL, would have non-HDL goal of < 100 mg/dL)    Assessment/Plan: Chronic wrist pain Referral to hand surgeon placed.  Recent x-rays results giving. Patient sent to Imaging department to pick up CD of x-ray films.

## 2015-02-17 NOTE — Patient Instructions (Signed)
Please take prescription cream to pharmacy.  Use as directed.  I have given you a copy of your x-ray results.  Go downstairs (Imaging Department) to pick up a CD with your x-ray images to give to the Orthopedist.

## 2015-02-17 NOTE — Progress Notes (Signed)
Pre visit review using our clinic review tool, if applicable. No additional management support is needed unless otherwise documented below in the visit note. 

## 2015-02-18 ENCOUNTER — Encounter: Payer: Self-pay | Admitting: *Deleted

## 2015-02-18 LAB — HM MAMMOGRAPHY: HM Mammogram: NORMAL

## 2015-03-04 ENCOUNTER — Other Ambulatory Visit: Payer: No Typology Code available for payment source

## 2015-03-10 ENCOUNTER — Ambulatory Visit: Payer: No Typology Code available for payment source | Admitting: Internal Medicine

## 2015-03-11 ENCOUNTER — Ambulatory Visit: Payer: No Typology Code available for payment source | Admitting: Internal Medicine

## 2015-03-18 ENCOUNTER — Ambulatory Visit: Payer: No Typology Code available for payment source | Admitting: Internal Medicine

## 2015-07-21 ENCOUNTER — Ambulatory Visit: Payer: Self-pay | Admitting: Internal Medicine

## 2015-11-17 ENCOUNTER — Telehealth: Payer: Self-pay | Admitting: Physician Assistant

## 2015-11-17 ENCOUNTER — Other Ambulatory Visit: Payer: Self-pay | Admitting: Physician Assistant

## 2015-11-17 DIAGNOSIS — E039 Hypothyroidism, unspecified: Secondary | ICD-10-CM

## 2015-11-17 MED ORDER — LEVOTHYROXINE SODIUM 100 MCG PO TABS: 100.0000 ug | ORAL_TABLET | Freq: Every day | ORAL | Status: DC

## 2015-11-17 NOTE — Telephone Encounter (Signed)
Last filled:  11/17/15  Med phoned in today.  No action required at this time.

## 2015-11-17 NOTE — Telephone Encounter (Signed)
Called and spoke with the pt and she stated that she will be going out of town and she will need a refill on both her cholesterol and thyroid medication.  Asked  the pt if she has any refills left on either medication, and she stated that she did not know.  Informed the pt that I will call the pharmacy to see if she has any refills.  Called and spoke with April in the pharmacy and she stated that the pt has 1 refill on the cholesterol but not on the thyroid.  Both medications where refilled with #30 only by phone with April.  Called and informed the pt that both medications were refilled, but she will need to call and schedule a follow-up appt with Einar Pheasant to have future refills.  Pt verbalized understanding and agreed.//AB/CMA

## 2015-11-17 NOTE — Telephone Encounter (Signed)
Caller name:Fradel Relationship to patient: Can be reached:[phone] Pharmacy: [facility] on [address]   Reason for call: levothyroxine 100 mg , cholesterol med she does not know the name

## 2016-05-04 ENCOUNTER — Encounter: Payer: Self-pay | Admitting: Physician Assistant

## 2016-05-04 ENCOUNTER — Ambulatory Visit (INDEPENDENT_AMBULATORY_CARE_PROVIDER_SITE_OTHER): Payer: BLUE CROSS/BLUE SHIELD | Admitting: Physician Assistant

## 2016-05-04 VITALS — BP 108/88 | HR 77 | Temp 98.6°F | Resp 16 | Ht 59.0 in | Wt 153.4 lb

## 2016-05-04 DIAGNOSIS — E039 Hypothyroidism, unspecified: Secondary | ICD-10-CM | POA: Diagnosis not present

## 2016-05-04 DIAGNOSIS — Z7689 Persons encountering health services in other specified circumstances: Secondary | ICD-10-CM

## 2016-05-04 DIAGNOSIS — Z0000 Encounter for general adult medical examination without abnormal findings: Secondary | ICD-10-CM

## 2016-05-04 DIAGNOSIS — Z0289 Encounter for other administrative examinations: Secondary | ICD-10-CM

## 2016-05-04 DIAGNOSIS — Z23 Encounter for immunization: Secondary | ICD-10-CM | POA: Diagnosis not present

## 2016-05-04 DIAGNOSIS — E785 Hyperlipidemia, unspecified: Secondary | ICD-10-CM | POA: Diagnosis not present

## 2016-05-04 LAB — COMPREHENSIVE METABOLIC PANEL
ALT: 23 U/L (ref 0–35)
AST: 20 U/L (ref 0–37)
Albumin: 3.8 g/dL (ref 3.5–5.2)
Alkaline Phosphatase: 65 U/L (ref 39–117)
BUN: 8 mg/dL (ref 6–23)
CALCIUM: 8.6 mg/dL (ref 8.4–10.5)
CO2: 28 meq/L (ref 19–32)
CREATININE: 0.71 mg/dL (ref 0.40–1.20)
Chloride: 104 mEq/L (ref 96–112)
GFR: 95.38 mL/min (ref >60.00)
Glucose, Bld: 87 mg/dL (ref 70–99)
Potassium: 4 mEq/L (ref 3.5–5.1)
Sodium: 137 mEq/L (ref 135–145)
Total Bilirubin: 0.3 mg/dL (ref 0.2–1.2)
Total Protein: 6.9 g/dL (ref 6.0–8.3)

## 2016-05-04 LAB — URINALYSIS, ROUTINE W REFLEX MICROSCOPIC
Bilirubin Urine: NEGATIVE
Hgb urine dipstick: NEGATIVE
Ketones, ur: NEGATIVE
Leukocytes, UA: NEGATIVE
Nitrite: NEGATIVE
Total Protein, Urine: NEGATIVE
UROBILINOGEN UA: 0.2 (ref 0.0–1.0)
Urine Glucose: NEGATIVE
pH: 5 (ref 5.0–8.0)

## 2016-05-04 LAB — LIPID PANEL
CHOL/HDL RATIO: 3
Cholesterol: 155 mg/dL (ref 0–200)
HDL: 50.6 mg/dL (ref >39.00)
LDL CALC: 87 mg/dL (ref 0–99)
NONHDL: 104.3
Triglycerides: 86 mg/dL (ref 0.0–149.0)
VLDL: 17.2 mg/dL (ref 0.0–40.0)

## 2016-05-04 LAB — CBC
HCT: 40.8 % (ref 36.0–46.0)
Hemoglobin: 13.8 g/dL (ref 12.0–15.0)
MCHC: 33.8 g/dL (ref 30.0–36.0)
MCV: 83.8 fl (ref 78.0–100.0)
PLATELETS: 234 10*3/uL (ref 150.0–400.0)
RBC: 4.87 Mil/uL (ref 3.87–5.11)
RDW: 13.3 % (ref 11.5–15.5)
WBC: 6.1 10*3/uL (ref 4.0–10.5)

## 2016-05-04 LAB — HEMOGLOBIN A1C: HEMOGLOBIN A1C: 5.9 % (ref 4.6–6.5)

## 2016-05-04 LAB — TSH: TSH: 3.49 u[IU]/mL (ref 0.35–4.50)

## 2016-05-04 NOTE — Progress Notes (Signed)
Patient presents to clinic today for annual exam.  Patient is fasting for labs.  Acute Concerns: Patient denies acute concerns today.  Chronic Issues: Hypothyroidism -- Is currently on levothyroxine 100 mcg daily. Is taking as directed. Thyroid has been well-controlled previously. Is due for repeat labs.  Hyperlipidemia -- Currently on Simvastatin 20 mg. Is taking daily as directed. Body mass index is 30.98 kg/m. Is trying to watch her diet -- vegetarian but does get adequate protein intake. Is working on increasing exercise.  Health Maintenance: Immunizations -- Flu shot update. Declines tetanus. PAP -- Last   Past Medical History:  Diagnosis Date  . Dysmenorrhea   . Hyperlipidemia   . Thyroid disease     Past Surgical History:  Procedure Laterality Date  . WISDOM TOOTH EXTRACTION      Current Outpatient Prescriptions on File Prior to Visit  Medication Sig Dispense Refill  . levothyroxine (SYNTHROID, LEVOTHROID) 100 MCG tablet Take 1 tablet (100 mcg total) by mouth daily. 30 tablet 0  . simvastatin (ZOCOR) 20 MG tablet Take 1 tablet (20 mg total) by mouth at bedtime. 90 tablet 1  . Triamcinolone Acetonide (TRIAMCINOLONE 0.1 % CREAM : EUCERIN) CREA Apply 1 application topically 2 (two) times daily as needed. 1 each 3   No current facility-administered medications on file prior to visit.     Allergies  Allergen Reactions  . Ibuprofen Swelling    Family History  Problem Relation Age of Onset  . Cancer Mother     lung  . Heart attack Father     Social History   Social History  . Marital status: Married    Spouse name: N/A  . Number of children: N/A  . Years of education: N/A   Occupational History  . Not on file.   Social History Main Topics  . Smoking status: Never Smoker  . Smokeless tobacco: Never Used  . Alcohol use No  . Drug use: No  . Sexual activity: Yes    Partners: Male   Other Topics Concern  . Not on file   Social History Narrative     Married since 1995   Lives in house, two stories, 6 persons live in home, no pets   No exercise       Review of Systems  Constitutional: Negative for fever and weight loss.  HENT: Negative for ear discharge, ear pain, hearing loss and tinnitus.   Eyes: Negative for blurred vision, double vision, photophobia and pain.  Respiratory: Negative for cough and shortness of breath.   Cardiovascular: Negative for chest pain and palpitations.  Gastrointestinal: Negative for abdominal pain, blood in stool, constipation, diarrhea, heartburn, melena, nausea and vomiting.  Genitourinary: Negative for dysuria, flank pain, frequency, hematuria and urgency.  Musculoskeletal: Negative for falls.  Neurological: Negative for dizziness, loss of consciousness and headaches.  Endo/Heme/Allergies: Negative for environmental allergies.  Psychiatric/Behavioral: Negative for depression, hallucinations, substance abuse and suicidal ideas. The patient is not nervous/anxious and does not have insomnia.     BP 108/88 (BP Location: Right Arm, Patient Position: Sitting, Cuff Size: Large)   Pulse 77   Temp 98.6 F (37 C) (Oral)   Resp 16   Ht 4\' 11"  (1.499 m)   Wt 153 lb 6 oz (69.6 kg)   LMP 04/06/2016   SpO2 98%   BMI 30.98 kg/m   Physical Exam  Constitutional: She is oriented to person, place, and time and well-developed, well-nourished, and in no distress.  HENT:  Head: Normocephalic and atraumatic.  Right Ear: Tympanic membrane, external ear and ear canal normal.  Left Ear: Tympanic membrane, external ear and ear canal normal.  Nose: Nose normal. No mucosal edema.  Mouth/Throat: Uvula is midline, oropharynx is clear and moist and mucous membranes are normal. No oropharyngeal exudate or posterior oropharyngeal erythema.  Eyes: Conjunctivae are normal. Pupils are equal, round, and reactive to light.  Neck: Neck supple. No thyromegaly present.  Cardiovascular: Normal rate, regular rhythm, normal heart  sounds and intact distal pulses.   Pulmonary/Chest: Effort normal and breath sounds normal. No respiratory distress. She has no wheezes. She has no rales.  Abdominal: Soft. Bowel sounds are normal. She exhibits no distension and no mass. There is no tenderness. There is no rebound and no guarding.  Lymphadenopathy:    She has no cervical adenopathy.  Neurological: She is alert and oriented to person, place, and time. No cranial nerve deficit.  Skin: Skin is warm and dry. No rash noted.  Psychiatric: Affect normal.  Vitals reviewed.  Assessment/Plan: Visit for preventive health examination Depression screen negative. Health Maintenance reviewed -- Flu shot updated today. Declines tetanus. Last PAP in 05/2015 and normal per patient. She is being set up with Gynecology per her request. Preventive schedule discussed and handout given in AVS. Will obtain fasting labs today.   Hypothyroidism Stable. Is compliant with medications. Will check TSH level today.  Hyperlipidemia Will repeat lipids and LFT today. Is taking statin daily without myalgias. Dietary and exercise recommendations reviewed with patient.     Leeanne Rio, PA-C

## 2016-05-04 NOTE — Assessment & Plan Note (Signed)
Stable. Is compliant with medications. Will check TSH level today.

## 2016-05-04 NOTE — Assessment & Plan Note (Signed)
Will repeat lipids and LFT today. Is taking statin daily without myalgias. Dietary and exercise recommendations reviewed with patient.

## 2016-05-04 NOTE — Patient Instructions (Signed)
Please go to the lab for blood work.   Our office will call you with your results unless you have chosen to receive results via MyChart.  If your blood work is normal we will follow-up each year for physicals and as scheduled for chronic medical problems.  If anything is abnormal we will treat accordingly and get you in for a follow-up.  You will be contacted to set up an appointment with Dr. Ihor Dow (Gynecology) across the hall.  Please schedule an appointment to have your vision checked. I would recommend the [facility] practices.  Preventive Care for Adults, Female A healthy lifestyle and preventive care can promote health and wellness. Preventive health guidelines for women include the following key practices.  A routine yearly physical is a good way to check with your health care provider about your health and preventive screening. It is a chance to share any concerns and updates on your health and to receive a thorough exam.  Visit your dentist for a routine exam and preventive care every 6 months. Brush your teeth twice a day and floss once a day. Good oral hygiene prevents tooth decay and gum disease.  The frequency of eye exams is based on your age, health, family medical history, use of contact lenses, and other factors. Follow your health care provider's recommendations for frequency of eye exams.  Eat a healthy diet. Foods like vegetables, fruits, whole grains, low-fat dairy products, and lean protein foods contain the nutrients you need without too many calories. Decrease your intake of foods high in solid fats, added sugars, and salt. Eat the right amount of calories for you.Get information about a proper diet from your health care provider, if necessary.  Regular physical exercise is one of the most important things you can do for your health. Most adults should get at least 150 minutes of moderate-intensity exercise (any activity that increases your heart rate and causes  you to sweat) each week. In addition, most adults need muscle-strengthening exercises on 2 or more days a week.  Maintain a healthy weight. The body mass index (BMI) is a screening tool to identify possible weight problems. It provides an estimate of body fat based on height and weight. Your health care provider can find your BMI and can help you achieve or maintain a healthy weight.For adults 20 years and older:  A BMI below 18.5 is considered underweight.  A BMI of 18.5 to 24.9 is normal.  A BMI of 25 to 29.9 is considered overweight.  A BMI of 30 and above is considered obese.  Maintain normal blood lipids and cholesterol levels by exercising and minimizing your intake of saturated fat. Eat a balanced diet with plenty of fruit and vegetables. Blood tests for lipids and cholesterol should begin at age 13 and be repeated every 5 years. If your lipid or cholesterol levels are high, you are over 50, or you are at high risk for heart disease, you may need your cholesterol levels checked more frequently.Ongoing high lipid and cholesterol levels should be treated with medicines if diet and exercise are not working.  If you smoke, find out from your health care provider how to quit. If you do not use tobacco, do not start.  Lung cancer screening is recommended for adults aged 68-80 years who are at high risk for developing lung cancer because of a history of smoking. A yearly low-dose CT scan of the lungs is recommended for people who have at least a 30-pack-year history of  smoking and are a current smoker or have quit within the past 15 years. A pack year of smoking is smoking an average of 1 pack of cigarettes a day for 1 year (for example: 1 pack a day for 30 years or 2 packs a day for 15 years). Yearly screening should continue until the smoker has stopped smoking for at least 15 years. Yearly screening should be stopped for people who develop a health problem that would prevent them from having  lung cancer treatment.  If you are pregnant, do not drink alcohol. If you are breastfeeding, be very cautious about drinking alcohol. If you are not pregnant and choose to drink alcohol, do not have more than 1 drink per day. One drink is considered to be 12 ounces (355 mL) of beer, 5 ounces (148 mL) of wine, or 1.5 ounces (44 mL) of liquor.  Avoid use of street drugs. Do not share needles with anyone. Ask for help if you need support or instructions about stopping the use of drugs.  High blood pressure causes heart disease and increases the risk of stroke. Your blood pressure should be checked at least every 1 to 2 years. Ongoing high blood pressure should be treated with medicines if weight loss and exercise do not work.  If you are 64-95 years old, ask your health care provider if you should take aspirin to prevent strokes.  Diabetes screening is done by taking a blood sample to check your blood glucose level after you have not eaten for a certain period of time (fasting). If you are not overweight and you do not have risk factors for diabetes, you should be screened once every 3 years starting at age 36. If you are overweight or obese and you are 68-42 years of age, you should be screened for diabetes every year as part of your cardiovascular risk assessment.  Breast cancer screening is essential preventive care for women. You should practice "breast self-awareness." This means understanding the normal appearance and feel of your breasts and may include breast self-examination. Any changes detected, no matter how small, should be reported to a health care provider. Women in their 70s and 30s should have a clinical breast exam (CBE) by a health care provider as part of a regular health exam every 1 to 3 years. After age 35, women should have a CBE every year. Starting at age 51, women should consider having a mammogram (breast X-ray test) every year. Women who have a family history of breast cancer  should talk to their health care provider about genetic screening. Women at a high risk of breast cancer should talk to their health care providers about having an MRI and a mammogram every year.  Breast cancer gene (BRCA)-related cancer risk assessment is recommended for women who have family members with BRCA-related cancers. BRCA-related cancers include breast, ovarian, tubal, and peritoneal cancers. Having family members with these cancers may be associated with an increased risk for harmful changes (mutations) in the breast cancer genes BRCA1 and BRCA2. Results of the assessment will determine the need for genetic counseling and BRCA1 and BRCA2 testing.  Your health care provider may recommend that you be screened regularly for cancer of the pelvic organs (ovaries, uterus, and vagina). This screening involves a pelvic examination, including checking for microscopic changes to the surface of your cervix (Pap test). You may be encouraged to have this screening done every 3 years, beginning at age 18.  For women ages 28-65, health care  providers may recommend pelvic exams and Pap testing every 3 years, or they may recommend the Pap and pelvic exam, combined with testing for human papilloma virus (HPV), every 5 years. Some types of HPV increase your risk of cervical cancer. Testing for HPV may also be done on women of any age with unclear Pap test results.  Other health care providers may not recommend any screening for nonpregnant women who are considered low risk for pelvic cancer and who do not have symptoms. Ask your health care provider if a screening pelvic exam is right for you.  If you have had past treatment for cervical cancer or a condition that could lead to cancer, you need Pap tests and screening for cancer for at least 20 years after your treatment. If Pap tests have been discontinued, your risk factors (such as having a new sexual partner) need to be reassessed to determine if screening  should resume. Some women have medical problems that increase the chance of getting cervical cancer. In these cases, your health care provider may recommend more frequent screening and Pap tests.  Colorectal cancer can be detected and often prevented. Most routine colorectal cancer screening begins at the age of 50 years and continues through age 41 years. However, your health care provider may recommend screening at an earlier age if you have risk factors for colon cancer. On a yearly basis, your health care provider may provide home test kits to check for hidden blood in the stool. Use of a small camera at the end of a tube, to directly examine the colon (sigmoidoscopy or colonoscopy), can detect the earliest forms of colorectal cancer. Talk to your health care provider about this at age 33, when routine screening begins. Direct exam of the colon should be repeated every 5-10 years through age 41 years, unless early forms of precancerous polyps or small growths are found.  People who are at an increased risk for hepatitis B should be screened for this virus. You are considered at high risk for hepatitis B if:  You were born in a country where hepatitis B occurs often. Talk with your health care provider about which countries are considered high risk.  Your parents were born in a high-risk country and you have not received a shot to protect against hepatitis B (hepatitis B vaccine).  You have HIV or AIDS.  You use needles to inject street drugs.  You live with, or have sex with, someone who has hepatitis B.  You get hemodialysis treatment.  You take certain medicines for conditions like cancer, organ transplantation, and autoimmune conditions.  Hepatitis C blood testing is recommended for all people born from 1 through 1963 and any individual with known risks for hepatitis C.  Practice safe sex. Use condoms and avoid high-risk sexual practices to reduce the spread of sexually transmitted  infections (STIs). STIs include gonorrhea, chlamydia, syphilis, trichomonas, herpes, HPV, and human immunodeficiency virus (HIV). Herpes, HIV, and HPV are viral illnesses that have no cure. They can result in disability, cancer, and death.  You should be screened for sexually transmitted illnesses (STIs) including gonorrhea and chlamydia if:  You are sexually active and are younger than 24 years.  You are older than 24 years and your health care provider tells you that you are at risk for this type of infection.  Your sexual activity has changed since you were last screened and you are at an increased risk for chlamydia or gonorrhea. Ask your health care provider  if you are at risk.  If you are at risk of being infected with HIV, it is recommended that you take a prescription medicine daily to prevent HIV infection. This is called preexposure prophylaxis (PrEP). You are considered at risk if:  You are sexually active and do not regularly use condoms or know the HIV status of your partner(s).  You take drugs by injection.  You are sexually active with a partner who has HIV.  Talk with your health care provider about whether you are at high risk of being infected with HIV. If you choose to begin PrEP, you should first be tested for HIV. You should then be tested every 3 months for as long as you are taking PrEP.  Osteoporosis is a disease in which the bones lose minerals and strength with aging. This can result in serious bone fractures or breaks. The risk of osteoporosis can be identified using a bone density scan. Women ages 63 years and over and women at risk for fractures or osteoporosis should discuss screening with their health care providers. Ask your health care provider whether you should take a calcium supplement or vitamin D to reduce the rate of osteoporosis.  Menopause can be associated with physical symptoms and risks. Hormone replacement therapy is available to decrease symptoms  and risks. You should talk to your health care provider about whether hormone replacement therapy is right for you.  Use sunscreen. Apply sunscreen liberally and repeatedly throughout the day. You should seek shade when your shadow is shorter than you. Protect yourself by wearing long sleeves, pants, a wide-brimmed hat, and sunglasses year round, whenever you are outdoors.  Once a month, do a whole body skin exam, using a mirror to look at the skin on your back. Tell your health care provider of new moles, moles that have irregular borders, moles that are larger than a pencil eraser, or moles that have changed in shape or color.  Stay current with required vaccines (immunizations).  Influenza vaccine. All adults should be immunized every year.  Tetanus, diphtheria, and acellular pertussis (Td, Tdap) vaccine. Pregnant women should receive 1 dose of Tdap vaccine during each pregnancy. The dose should be obtained regardless of the length of time since the last dose. Immunization is preferred during the 27th-36th week of gestation. An adult who has not previously received Tdap or who does not know her vaccine status should receive 1 dose of Tdap. This initial dose should be followed by tetanus and diphtheria toxoids (Td) booster doses every 10 years. Adults with an unknown or incomplete history of completing a 3-dose immunization series with Td-containing vaccines should begin or complete a primary immunization series including a Tdap dose. Adults should receive a Td booster every 10 years.  Varicella vaccine. An adult without evidence of immunity to varicella should receive 2 doses or a second dose if she has previously received 1 dose. Pregnant females who do not have evidence of immunity should receive the first dose after pregnancy. This first dose should be obtained before leaving the health care facility. The second dose should be obtained 4-8 weeks after the first dose.  Human papillomavirus (HPV)  vaccine. Females aged 13-26 years who have not received the vaccine previously should obtain the 3-dose series. The vaccine is not recommended for use in pregnant females. However, pregnancy testing is not needed before receiving a dose. If a female is found to be pregnant after receiving a dose, no treatment is needed. In that case, the  remaining doses should be delayed until after the pregnancy. Immunization is recommended for any person with an immunocompromised condition through the age of 59 years if she did not get any or all doses earlier. During the 3-dose series, the second dose should be obtained 4-8 weeks after the first dose. The third dose should be obtained 24 weeks after the first dose and 16 weeks after the second dose.  Zoster vaccine. One dose is recommended for adults aged 46 years or older unless certain conditions are present.  Measles, mumps, and rubella (MMR) vaccine. Adults born before [birth year] generally are considered immune to measles and mumps. Adults born in [birth year] or later should have 1 or more doses of MMR vaccine unless there is a contraindication to the vaccine or there is laboratory evidence of immunity to each of the three diseases. A routine second dose of MMR vaccine should be obtained at least 28 days after the first dose for students attending postsecondary schools, health care workers, or international travelers. People who received inactivated measles vaccine or an unknown type of measles vaccine during 1963-1967 should receive 2 doses of MMR vaccine. People who received inactivated mumps vaccine or an unknown type of mumps vaccine before 1979 and are at high risk for mumps infection should consider immunization with 2 doses of MMR vaccine. For females of childbearing age, rubella immunity should be determined. If there is no evidence of immunity, females who are not pregnant should be vaccinated. If there is no evidence of immunity, females who are pregnant should delay  immunization until after pregnancy. Unvaccinated health care workers born before [birth year] who lack laboratory evidence of measles, mumps, or rubella immunity or laboratory confirmation of disease should consider measles and mumps immunization with 2 doses of MMR vaccine or rubella immunization with 1 dose of MMR vaccine.  Pneumococcal 13-valent conjugate (PCV13) vaccine. When indicated, a person who is uncertain of his immunization history and has no record of immunization should receive the PCV13 vaccine. All adults 3 years of age and older should receive this vaccine. An adult aged 14 years or older who has certain medical conditions and has not been previously immunized should receive 1 dose of PCV13 vaccine. This PCV13 should be followed with a dose of pneumococcal polysaccharide (PPSV23) vaccine. Adults who are at high risk for pneumococcal disease should obtain the PPSV23 vaccine at least 8 weeks after the dose of PCV13 vaccine. Adults older than 43 years of age who have normal immune system function should obtain the PPSV23 vaccine dose at least 1 year after the dose of PCV13 vaccine.  Pneumococcal polysaccharide (PPSV23) vaccine. When PCV13 is also indicated, PCV13 should be obtained first. All adults aged 37 years and older should be immunized. An adult younger than age 33 years who has certain medical conditions should be immunized. Any person who resides in a nursing home or long-term care facility should be immunized. An adult smoker should be immunized. People with an immunocompromised condition and certain other conditions should receive both PCV13 and PPSV23 vaccines. People with human immunodeficiency virus (HIV) infection should be immunized as soon as possible after diagnosis. Immunization during chemotherapy or radiation therapy should be avoided. Routine use of PPSV23 vaccine is not recommended for American Indians, [place] Natives, or people younger than 65 years unless there are medical  conditions that require PPSV23 vaccine. When indicated, people who have unknown immunization and have no record of immunization should receive PPSV23 vaccine. One-time revaccination 5 years after the first dose  of PPSV23 is recommended for people aged 19-64 years who have chronic kidney failure, nephrotic syndrome, asplenia, or immunocompromised conditions. People who received 1-2 doses of PPSV23 before age 63 years should receive another dose of PPSV23 vaccine at age 39 years or later if at least 5 years have passed since the previous dose. Doses of PPSV23 are not needed for people immunized with PPSV23 at or after age 45 years.  Meningococcal vaccine. Adults with asplenia or persistent complement component deficiencies should receive 2 doses of quadrivalent meningococcal conjugate (MenACWY-D) vaccine. The doses should be obtained at least 2 months apart. Microbiologists working with certain meningococcal bacteria, [employer] recruits, people at risk during an outbreak, and people who travel to or live in countries with a high rate of meningitis should be immunized. A first-year college student up through age 92 years who is living in a residence hall should receive a dose if she did not receive a dose on or after her 16th birthday. Adults who have certain high-risk conditions should receive one or more doses of vaccine.  Hepatitis A vaccine. Adults who wish to be protected from this disease, have certain high-risk conditions, work with hepatitis A-infected animals, work in hepatitis A research labs, or travel to or work in countries with a high rate of hepatitis A should be immunized. Adults who were previously unvaccinated and who anticipate close contact with an international adoptee during the first 60 days after arrival in the [place] from a country with a high rate of hepatitis A should be immunized.  Hepatitis B vaccine. Adults who wish to be protected from this disease, have certain high-risk  conditions, may be exposed to blood or other infectious body fluids, are household contacts or sex partners of hepatitis B positive people, are clients or workers in certain care facilities, or travel to or work in countries with a high rate of hepatitis B should be immunized.  Haemophilus influenzae type b (Hib) vaccine. A previously unvaccinated person with asplenia or sickle cell disease or having a scheduled splenectomy should receive 1 dose of Hib vaccine. Regardless of previous immunization, a recipient of a hematopoietic stem cell transplant should receive a 3-dose series 6-12 months after her successful transplant. Hib vaccine is not recommended for adults with HIV infection. Preventive Services / Frequency Ages 39 to 31 years  Blood pressure check.** / Every 3-5 years.  Lipid and cholesterol check.** / Every 5 years beginning at age 55.  Clinical breast exam.** / Every 3 years for women in their 21s and 49s.  BRCA-related cancer risk assessment.** / For women who have family members with a BRCA-related cancer (breast, ovarian, tubal, or peritoneal cancers).  Pap test.** / Every 2 years from ages 48 through 92. Every 3 years starting at age 23 through age 75 or 109 with a history of 3 consecutive normal Pap tests.  HPV screening.** / Every 3 years from ages 25 through ages 29 to 61 with a history of 3 consecutive normal Pap tests.  Hepatitis C blood test.** / For any individual with known risks for hepatitis C.  Skin self-exam. / Monthly.  Influenza vaccine. / Every year.  Tetanus, diphtheria, and acellular pertussis (Tdap, Td) vaccine.** / Consult your health care provider. Pregnant women should receive 1 dose of Tdap vaccine during each pregnancy. 1 dose of Td every 10 years.  Varicella vaccine.** / Consult your health care provider. Pregnant females who do not have evidence of immunity should receive the first dose after pregnancy.  HPV vaccine. / 3 doses over 6 months, if 70  and younger. The vaccine is not recommended for use in pregnant females. However, pregnancy testing is not needed before receiving a dose.  Measles, mumps, rubella (MMR) vaccine.** / You need at least 1 dose of MMR if you were born in 1957 or later. You may also need a 2nd dose. For females of childbearing age, rubella immunity should be determined. If there is no evidence of immunity, females who are not pregnant should be vaccinated. If there is no evidence of immunity, females who are pregnant should delay immunization until after pregnancy.  Pneumococcal 13-valent conjugate (PCV13) vaccine.** / Consult your health care provider.  Pneumococcal polysaccharide (PPSV23) vaccine.** / 1 to 2 doses if you smoke cigarettes or if you have certain conditions.  Meningococcal vaccine.** / 1 dose if you are age 2 to 36 years and a Market researcher living in a residence hall, or have one of several medical conditions, you need to get vaccinated against meningococcal disease. You may also need additional booster doses.  Hepatitis A vaccine.** / Consult your health care provider.  Hepatitis B vaccine.** / Consult your health care provider.  Haemophilus influenzae type b (Hib) vaccine.** / Consult your health care provider. Ages 32 to 8 years  Blood pressure check.** / Every year.  Lipid and cholesterol check.** / Every 5 years beginning at age 40 years.  Lung cancer screening. / Every year if you are aged 54-80 years and have a 30-pack-year history of smoking and currently smoke or have quit within the past 15 years. Yearly screening is stopped once you have quit smoking for at least 15 years or develop a health problem that would prevent you from having lung cancer treatment.  Clinical breast exam.** / Every year after age 39 years.  BRCA-related cancer risk assessment.** / For women who have family members with a BRCA-related cancer (breast, ovarian, tubal, or peritoneal  cancers).  Mammogram.** / Every year beginning at age 55 years and continuing for as long as you are in good health. Consult with your health care provider.  Pap test.** / Every 3 years starting at age 33 years through age 54 or 23 years with a history of 3 consecutive normal Pap tests.  HPV screening.** / Every 3 years from ages 77 years through ages 17 to 55 years with a history of 3 consecutive normal Pap tests.  Fecal occult blood test (FOBT) of stool. / Every year beginning at age 75 years and continuing until age 58 years. You may not need to do this test if you get a colonoscopy every 10 years.  Flexible sigmoidoscopy or colonoscopy.** / Every 5 years for a flexible sigmoidoscopy or every 10 years for a colonoscopy beginning at age 48 years and continuing until age 77 years.  Hepatitis C blood test.** / For all people born from [birth year] through 1963 and any individual with known risks for hepatitis C.  Skin self-exam. / Monthly.  Influenza vaccine. / Every year.  Tetanus, diphtheria, and acellular pertussis (Tdap/Td) vaccine.** / Consult your health care provider. Pregnant women should receive 1 dose of Tdap vaccine during each pregnancy. 1 dose of Td every 10 years.  Varicella vaccine.** / Consult your health care provider. Pregnant females who do not have evidence of immunity should receive the first dose after pregnancy.  Zoster vaccine.** / 1 dose for adults aged 59 years or older.  Measles, mumps, rubella (MMR) vaccine.** / You need at least  1 dose of MMR if you were born in 1957 or later. You may also need a second dose. For females of childbearing age, rubella immunity should be determined. If there is no evidence of immunity, females who are not pregnant should be vaccinated. If there is no evidence of immunity, females who are pregnant should delay immunization until after pregnancy.  Pneumococcal 13-valent conjugate (PCV13) vaccine.** / Consult your health care  provider.  Pneumococcal polysaccharide (PPSV23) vaccine.** / 1 to 2 doses if you smoke cigarettes or if you have certain conditions.  Meningococcal vaccine.** / Consult your health care provider.  Hepatitis A vaccine.** / Consult your health care provider.  Hepatitis B vaccine.** / Consult your health care provider.  Haemophilus influenzae type b (Hib) vaccine.** / Consult your health care provider. Ages 29 years and over  Blood pressure check.** / Every year.  Lipid and cholesterol check.** / Every 5 years beginning at age 66 years.  Lung cancer screening. / Every year if you are aged 64-80 years and have a 30-pack-year history of smoking and currently smoke or have quit within the past 15 years. Yearly screening is stopped once you have quit smoking for at least 15 years or develop a health problem that would prevent you from having lung cancer treatment.  Clinical breast exam.** / Every year after age 9 years.  BRCA-related cancer risk assessment.** / For women who have family members with a BRCA-related cancer (breast, ovarian, tubal, or peritoneal cancers).  Mammogram.** / Every year beginning at age 14 years and continuing for as long as you are in good health. Consult with your health care provider.  Pap test.** / Every 3 years starting at age 62 years through age 18 or 18 years with 3 consecutive normal Pap tests. Testing can be stopped between 65 and 70 years with 3 consecutive normal Pap tests and no abnormal Pap or HPV tests in the past 10 years.  HPV screening.** / Every 3 years from ages 57 years through ages 101 or 67 years with a history of 3 consecutive normal Pap tests. Testing can be stopped between 65 and 70 years with 3 consecutive normal Pap tests and no abnormal Pap or HPV tests in the past 10 years.  Fecal occult blood test (FOBT) of stool. / Every year beginning at age 50 years and continuing until age 16 years. You may not need to do this test if you get a  colonoscopy every 10 years.  Flexible sigmoidoscopy or colonoscopy.** / Every 5 years for a flexible sigmoidoscopy or every 10 years for a colonoscopy beginning at age 49 years and continuing until age 59 years.  Hepatitis C blood test.** / For all people born from [birth year] through 1963 and any individual with known risks for hepatitis C.  Osteoporosis screening.** / A one-time screening for women ages 52 years and over and women at risk for fractures or osteoporosis.  Skin self-exam. / Monthly.  Influenza vaccine. / Every year.  Tetanus, diphtheria, and acellular pertussis (Tdap/Td) vaccine.** / 1 dose of Td every 10 years.  Varicella vaccine.** / Consult your health care provider.  Zoster vaccine.** / 1 dose for adults aged 1 years or older.  Pneumococcal 13-valent conjugate (PCV13) vaccine.** / Consult your health care provider.  Pneumococcal polysaccharide (PPSV23) vaccine.** / 1 dose for all adults aged 21 years and older.  Meningococcal vaccine.** / Consult your health care provider.  Hepatitis A vaccine.** / Consult your health care provider.  Hepatitis B vaccine.** /  Consult your health care provider.  Haemophilus influenzae type b (Hib) vaccine.** / Consult your health care provider. ** Family history and personal history of risk and conditions may change your health care provider's recommendations.   This information is not intended to replace advice given to you by your health care provider. Make sure you discuss any questions you have with your health care provider.   Document Released: 10/04/2001 Document Revised: 08/29/2014 Document Reviewed: 01/03/2011 Elsevier Interactive Patient Education [employer].

## 2016-05-04 NOTE — Assessment & Plan Note (Signed)
Depression screen negative. Health Maintenance reviewed -- Flu shot updated today. Declines tetanus. Last PAP in 05/2015 and normal per patient. She is being set up with Gynecology per her request. Preventive schedule discussed and handout given in AVS. Will obtain fasting labs today.

## 2016-05-06 ENCOUNTER — Other Ambulatory Visit: Payer: Self-pay | Admitting: Physician Assistant

## 2016-05-06 DIAGNOSIS — E785 Hyperlipidemia, unspecified: Secondary | ICD-10-CM

## 2016-05-06 DIAGNOSIS — E039 Hypothyroidism, unspecified: Secondary | ICD-10-CM

## 2016-05-06 MED ORDER — LEVOTHYROXINE SODIUM 100 MCG PO TABS: 100.0000 ug | ORAL_TABLET | Freq: Every day | ORAL | 3 refills | Status: DC

## 2016-05-06 MED ORDER — SIMVASTATIN 20 MG PO TABS: 20.0000 mg | ORAL_TABLET | Freq: Every day | ORAL | 3 refills | Status: DC

## 2016-08-03 ENCOUNTER — Encounter: Payer: Self-pay | Admitting: Medical

## 2016-08-03 ENCOUNTER — Ambulatory Visit (INDEPENDENT_AMBULATORY_CARE_PROVIDER_SITE_OTHER): Payer: BLUE CROSS/BLUE SHIELD | Admitting: Medical

## 2016-08-03 VITALS — BP 132/92 | HR 91 | Temp 98.1°F | Ht 59.0 in | Wt 152.8 lb

## 2016-08-03 DIAGNOSIS — E785 Hyperlipidemia, unspecified: Secondary | ICD-10-CM | POA: Diagnosis not present

## 2016-08-03 DIAGNOSIS — M546 Pain in thoracic spine: Secondary | ICD-10-CM

## 2016-08-03 DIAGNOSIS — E039 Hypothyroidism, unspecified: Secondary | ICD-10-CM | POA: Diagnosis not present

## 2016-08-03 DIAGNOSIS — S46812A Strain of other muscles, fascia and tendons at shoulder and upper arm level, left arm, initial encounter: Secondary | ICD-10-CM | POA: Diagnosis not present

## 2016-08-03 DIAGNOSIS — R030 Elevated blood-pressure reading, without diagnosis of hypertension: Secondary | ICD-10-CM

## 2016-08-03 DIAGNOSIS — J309 Allergic rhinitis, unspecified: Secondary | ICD-10-CM

## 2016-08-03 LAB — LIPID PANEL
CHOLESTEROL: 189 mg/dL (ref 0–200)
HDL: 54 mg/dL (ref >39.00)
LDL CALC: 103 mg/dL — AB (ref 0–99)
NONHDL: 134.81
Total CHOL/HDL Ratio: 3
Triglycerides: 158 mg/dL — ABNORMAL HIGH (ref 0.0–149.0)
VLDL: 31.6 mg/dL (ref 0.0–40.0)

## 2016-08-03 LAB — TSH: TSH: 0.76 u[IU]/mL (ref 0.35–4.50)

## 2016-08-03 LAB — T4, FREE: FREE T4: 1.17 ng/dL (ref 0.60–1.60)

## 2016-08-03 MED ORDER — FLUTICASONE PROPIONATE 50 MCG/ACT NA SUSP: 2.0000 | Freq: Every day | NASAL | 1 refills | Status: DC

## 2016-08-03 MED ORDER — CYCLOBENZAPRINE HCL 5 MG PO TABS: ORAL_TABLET | ORAL | 0 refills | Status: DC

## 2016-08-03 NOTE — Progress Notes (Signed)
Subjective:    Patient ID: Rebekah Knox, female    DOB: 1970-11-30, 45 y.o.   MRN: [id number]  HPI  Pt in with some possible  reported elevated blood pressure. She thinks her bp elevated since she had some ha for 3 days. She also states some upper back and neck area. Pt works Environmental consultant. She states pain at end of the day is worse. She is working more recently since employee is on vacation.  Pt states woke up today and her HA was gone. HA seemed to occur with neck trapezius pain after working long shifts. Pain is occipital as well. No gross motor or sensory function deficits on review.   Pt blood pressure was 150/98 yesterday and her home. Pt took 10 mg of lisinopril. She took 1/2 of her dads 20 mg tabs.  Nasal congestion and mild watery eyes. Maxillary sinus region swelling but not pain past week. No  Frontal sinus pain. No ear pain. No fevers, no chills and no sweats. No cough.  Pt request refill fo thyroid med for one year.  Pt also wants lipid panel checkec.       Review of Systems  HENT: Positive for congestion and sinus pressure. Negative for postnasal drip, rhinorrhea and sinus pain.   Eyes: Negative for photophobia, discharge, itching and visual disturbance.       Watery eye with nasal congestion  Respiratory: Negative for cough, choking, shortness of breath and wheezing.   Cardiovascular: Negative for chest pain and palpitations.  Gastrointestinal: Negative for abdominal pain.  Musculoskeletal: Positive for back pain, myalgias and neck pain.  Skin: Negative for rash.  Neurological: Positive for headaches. Negative for dizziness, syncope, facial asymmetry, speech difficulty, weakness, light-headedness and numbness.  Hematological: Negative for adenopathy. Does not bruise/bleed easily.  Psychiatric/Behavioral: Negative for behavioral problems, confusion and dysphoric mood.   Past Medical History:  Diagnosis Date  . Dysmenorrhea   . Hyperlipidemia   . Thyroid  disease      Social History   Social History  . Marital status: Married    Spouse name: N/A  . Number of children: N/A  . Years of education: N/A   Occupational History  . Not on file.   Social History Main Topics  . Smoking status: Never Smoker  . Smokeless tobacco: Never Used  . Alcohol use No  . Drug use: No  . Sexual activity: Yes    Partners: Male   Other Topics Concern  . Not on file   Social History Narrative   Married since 1995   Lives in house, two stories, 6 persons live in home, no pets   No exercise        Past Surgical History:  Procedure Laterality Date  . WISDOM TOOTH EXTRACTION      Family History  Problem Relation Age of Onset  . Cancer Mother     lung  . Heart attack Father     Allergies  Allergen Reactions  . Ibuprofen Swelling    Current Outpatient Prescriptions on File Prior to Visit  Medication Sig Dispense Refill  . levothyroxine (SYNTHROID, LEVOTHROID) 100 MCG tablet Take 1 tablet (100 mcg total) by mouth daily. 30 tablet 3  . simvastatin (ZOCOR) 20 MG tablet Take 1 tablet (20 mg total) by mouth at bedtime. 30 tablet 3  . Triamcinolone Acetonide (TRIAMCINOLONE 0.1 % CREAM : EUCERIN) CREA Apply 1 application topically 2 (two) times daily as needed. 1 each 3   No current facility-administered  medications on file prior to visit.     BP (!) 132/92 (BP Location: Right Arm, Patient Position: Sitting, Cuff Size: Normal)   Pulse 91   Temp 98.1 F (36.7 C) (Oral)   Ht 4\' 11"  (1.499 m)   Wt 152 lb 12.8 oz (69.3 kg)   LMP 06/03/2016   SpO2 98%   BMI 30.86 kg/m       Objective:   Physical Exam  General  Mental Status - Alert. General Appearance - Well groomed. Not in acute distress.  Skin Rashes- No Rashes.  HEENT Head- Normal. Ear Auditory Canal - Left- Normal. Right - Normal.Tympanic Membrane- Left- Normal. Right- Normal. Eye Sclera/Conjunctiva- Left- Normal. Right- Normal. Nose & Sinuses Nasal Mucosa- Left-  Boggy  and Congested. Right-  Boggy and  Congested.Bilateral no maxillary and no  frontal sinus pressure. Mouth & Throat Lips: Upper Lip- Normal: no dryness, cracking, pallor, cyanosis, or vesicular eruption. Lower Lip-Normal: no dryness, cracking, pallor, cyanosis or vesicular eruption. Buccal Mucosa- Bilateral- No Aphthous ulcers. Oropharynx- No Discharge or Erythema. Tonsils: Characteristics- Bilateral- No Erythema or Congestion. Size/Enlargement- Bilateral- No enlargement. Discharge- bilateral-None.  Neck Neck- Supple. No Masses. Left trapezius moderate tender to palpation. Good rom.   Chest and Lung Exam Auscultation: Breath Sounds:-Clear even and unlabored.  Cardiovascular Auscultation:Rythm- Regular, rate and rhythm. Murmurs & Other Heart Sounds:Ausculatation of the heart reveal- No Murmurs.  Lymphatic Head & Neck General Head & Neck Lymphatics: Bilateral: Description- No Localized lymphadenopathy.  Neurologic Cranial Nerve exam:- CN III-XII intact(No nystagmus), symmetric smile. Drift Test:- No drift. Finger to Nose:- Normal/Intact Strength:- 5/5 equal and symmetric strength both upper and lower extremities.  Back- tspine paraspinal tenderness. No mid tspine pain.       Assessment & Plan:  Your back pain and trapezius pain is likely related to long work hours and standing all day. I want you to try tylenol for mild pain. At night if trapezius muscle sore then use flexeril.  Your bp was elevated only one time. I want you to check your bp daily. If your bp is 140/90 or higher then I will rx bp medicine. Low salt diet and exercise daily.  You may have uri or allergy. I will rx flonase nasal spray. If you develop pain over your sinuses then will rx antibiotic.  Will get tsh, t4 today and lipid panel.   Follow up in 10-14 days with bp readings. Call sooner if bp consistently over 140/90  Aarib Pulido, Percell Miller, PA-C

## 2016-08-03 NOTE — Progress Notes (Signed)
Pre visit review using our clinic review tool, if applicable. No additional management support is needed unless otherwise documented below in the visit note. 

## 2016-08-03 NOTE — Patient Instructions (Signed)
Your back pain and trapezius pain is likely related to long work hours and standing all day. I want you to try tylenol for mild pain. At night if trapezius muscle sore then use flexeril.  Your bp was elevated only one time. I want you to check your bp daily. If your bp is 140/90 or higher then I will rx bp medicine. Low salt diet and exercise daily.  You may have uri or allergy. I will rx flonase nasal spray. If you develop pain over your sinuses then will rx antibiotic.  Will get tsh, t4 today and lipid panel.   Follow up in 10-14 days with bp readings. Call sooner if bp consistently over 140/90

## 2016-08-05 ENCOUNTER — Telehealth: Payer: Self-pay | Admitting: Medical

## 2016-08-05 DIAGNOSIS — E785 Hyperlipidemia, unspecified: Secondary | ICD-10-CM

## 2016-08-05 MED ORDER — LEVOTHYROXINE SODIUM 100 MCG PO TABS: 100.0000 ug | ORAL_TABLET | Freq: Every day | ORAL | 3 refills | Status: DC

## 2016-08-05 MED ORDER — SIMVASTATIN 20 MG PO TABS: 20.0000 mg | ORAL_TABLET | Freq: Every day | ORAL | 1 refills | Status: DC

## 2016-08-05 MED ORDER — LEVOTHYROXINE SODIUM 100 MCG PO TABS: 100.0000 ug | ORAL_TABLET | Freq: Every day | ORAL | 1 refills | Status: DC

## 2016-08-05 NOTE — Telephone Encounter (Signed)
Refilled pt zocor and levothyroxine.

## 2016-08-10 ENCOUNTER — Other Ambulatory Visit: Payer: Self-pay

## 2016-08-10 ENCOUNTER — Telehealth: Payer: Self-pay | Admitting: Medical

## 2016-08-10 NOTE — Telephone Encounter (Signed)
Patient Name: Rebekah Knox  DOB: [birth year]    Initial Comment Callers wife blood pressure is 144/107 and wanting to know if she should be on medicine.    Nurse Assessment  Nurse: Christel Mormon, RN, Levada Dy Date/Time Eilene Ghazi Time): 08/10/2016 3:32:02 PM  Confirm and document reason for call. If symptomatic, describe symptoms. ---Callers wife blood pressure is 144/107 and wanting to know if she should be on medicine. He does not remember her last BP.  Does the patient have any new or worsening symptoms? ---Yes  Will a triage be completed? ---Yes  Related visit to physician within the last 2 weeks? ---No  Does the PT have any chronic conditions? (i.e. diabetes, asthma, etc.) ---Yes  List chronic conditions. ---hypothyroidism, high cholesterol  Is the patient pregnant or possibly pregnant? (Ask all females between the ages of 75-55) ---No  Is this a behavioral health or substance abuse call? ---No     Guidelines    Guideline Title Affirmed Question Affirmed Notes  High Blood Pressure BP ? 160/100    Final Disposition User   See PCP When Office is Open (within 3 days) [place], RN, Levada Dy    Referrals  REFERRED TO PCP OFFICE   Disagree/Comply: Leta Baptist

## 2016-08-10 NOTE — Telephone Encounter (Signed)
Called patient. Husband states patient ha appointment scheduled for tomorrow.

## 2016-08-10 NOTE — Telephone Encounter (Signed)
Patient has an appointment scheduled with Mackie Pai, PA-C on 08/11/16 at 1:00 PM.

## 2016-08-11 ENCOUNTER — Encounter: Payer: Self-pay | Admitting: Medical

## 2016-08-11 ENCOUNTER — Ambulatory Visit (INDEPENDENT_AMBULATORY_CARE_PROVIDER_SITE_OTHER): Payer: BLUE CROSS/BLUE SHIELD | Admitting: Medical

## 2016-08-11 VITALS — BP 132/76 | HR 81 | Temp 97.5°F | Resp 16 | Ht <= 58 in | Wt 153.8 lb

## 2016-08-11 DIAGNOSIS — E785 Hyperlipidemia, unspecified: Secondary | ICD-10-CM

## 2016-08-11 DIAGNOSIS — E039 Hypothyroidism, unspecified: Secondary | ICD-10-CM

## 2016-08-11 NOTE — Progress Notes (Signed)
Subjective:    Patient ID: Rebekah Knox, female    DOB: 1971/02/01, 45 y.o.   MRN: [id number]  HPI  Pt in for bp evaluation. Pt had high bp readings at home with machine. Pt states is new machine. Bp readings were 140+/90+ with machine. But in our office much better. Nurse educated her husband on how to take readings and he showed nurse that he could take and understands technique. He got good bp reading in office.    Pt has no chest pain or neurologic signs or symptoms.   Review of Systems  Constitutional: Negative for chills, fatigue and fever.  Respiratory: Negative for cough, chest tightness, shortness of breath and wheezing.   Cardiovascular: Negative for chest pain and palpitations.  Gastrointestinal: Negative for abdominal pain.  Musculoskeletal: Negative for back pain.  Skin: Negative for rash.  Neurological: Negative for dizziness, seizures and headaches.  Hematological: Negative for adenopathy. Does not bruise/bleed easily.  Psychiatric/Behavioral: Negative for behavioral problems and confusion.     Past Medical History:  Diagnosis Date  . Dysmenorrhea   . Hyperlipidemia   . Thyroid disease      Social History   Social History  . Marital status: Married    Spouse name: N/A  . Number of children: N/A  . Years of education: N/A   Occupational History  . Not on file.   Social History Main Topics  . Smoking status: Never Smoker  . Smokeless tobacco: Never Used  . Alcohol use No  . Drug use: No  . Sexual activity: Yes    Partners: Male   Other Topics Concern  . Not on file   Social History Narrative   Married since 1995   Lives in house, two stories, 6 persons live in home, no pets   No exercise        Past Surgical History:  Procedure Laterality Date  . WISDOM TOOTH EXTRACTION      Family History  Problem Relation Age of Onset  . Cancer Mother     lung  . Heart attack Father     Allergies  Allergen Reactions  . Ibuprofen Swelling     Current Outpatient Prescriptions on File Prior to Visit  Medication Sig Dispense Refill  . cyclobenzaprine (FLEXERIL) 5 MG tablet 1 tab po q hs as needed muscle spasms 10 tablet 0  . fluticasone (FLONASE) 50 MCG/ACT nasal spray Place 2 sprays into both nostrils daily. 16 g 1  . levothyroxine (SYNTHROID, LEVOTHROID) 100 MCG tablet Take 1 tablet (100 mcg total) by mouth daily. 30 tablet 3  . simvastatin (ZOCOR) 20 MG tablet Take 1 tablet (20 mg total) by mouth at bedtime. 90 tablet 1  . Triamcinolone Acetonide (TRIAMCINOLONE 0.1 % CREAM : EUCERIN) CREA Apply 1 application topically 2 (two) times daily as needed. 1 each 3   No current facility-administered medications on file prior to visit.     BP 132/76 (BP Location: Left Arm, Patient Position: Sitting, Cuff Size: Normal)   Pulse 81   Temp 97.5 F (36.4 C) (Oral)   Resp 16   Ht 4\' 10"  (1.473 m)   Wt 153 lb 12.8 oz (69.8 kg)   LMP 06/03/2016   SpO2 98%   BMI 32.14 kg/m        Objective:   Physical Exam  General Mental Status- Alert. General Appearance- Not in acute distress.    Neck Carotid Arteries- Normal color. Moisture- Normal Moisture. No carotid bruits. No JVD.  Chest and Lung Exam Auscultation: Breath Sounds:-Normal.  Cardiovascular Auscultation:Rythm- Regular. Murmurs & Other Heart Sounds:Auscultation of the heart reveals- No Murmurs.   Neurologic Cranial Nerve exam:- CN III-XII intact(No nystagmus), symmetric smile. Strength:- 5/5 equal and symmetric strength both upper and lower extremities.      Assessment & Plan:  For your high cholesterol and low thyroid history continue your current medications and follow up in 6 months.  I want you to check your bp manually and if your bp is 140/90 or above consistently let us know. Check 1-2 times a week.  Follow up in in 6 months.

## 2016-08-11 NOTE — Patient Instructions (Addendum)
For your high cholesterol and low thyroid history continue your current medications and follow up in 6 months.  I want you to check your bp manually and if your bp is 140/90 or above consistently let us know. Check 1-2 times a week.  Follow up in in 6 months.

## 2016-08-17 ENCOUNTER — Ambulatory Visit: Payer: BLUE CROSS/BLUE SHIELD | Admitting: Medical

## 2017-02-20 ENCOUNTER — Encounter: Payer: Self-pay | Admitting: Obstetrics and Gynecology

## 2017-02-20 ENCOUNTER — Ambulatory Visit (INDEPENDENT_AMBULATORY_CARE_PROVIDER_SITE_OTHER): Payer: BLUE CROSS/BLUE SHIELD | Admitting: Obstetrics and Gynecology

## 2017-02-20 ENCOUNTER — Other Ambulatory Visit (HOSPITAL_COMMUNITY)
Admission: RE | Admit: 2017-02-20 | Discharge: 2017-02-20 | Disposition: A | Discharge status: Home / Self Care | Payer: BLUE CROSS/BLUE SHIELD | Source: Ambulatory Visit | Attending: Obstetrics and Gynecology | Admitting: Obstetrics and Gynecology

## 2017-02-20 VITALS — BP 124/78 | HR 84 | Resp 16 | Ht <= 58 in | Wt 152.0 lb

## 2017-02-20 DIAGNOSIS — Z30431 Encounter for routine checking of intrauterine contraceptive device: Secondary | ICD-10-CM

## 2017-02-20 DIAGNOSIS — Z124 Encounter for screening for malignant neoplasm of cervix: Secondary | ICD-10-CM | POA: Diagnosis not present

## 2017-02-20 DIAGNOSIS — Z01419 Encounter for gynecological examination (general) (routine) without abnormal findings: Secondary | ICD-10-CM

## 2017-02-20 NOTE — Progress Notes (Addendum)
46 y.o. T5V2023 MarriedAsianF here for annual exam.   She has a h/o a hysteroscopic polypectomy 1.5 years ago. She then had an IUD placed in [place]. She had very heavy periods prior to the polypectomy. She has light spotty cycles. Cycles every 4-6 weeks x 2 days, light. Only needs a panty liner. No cramps. No dyspareunia.     No LMP recorded. Patient is not currently having periods (Reason: IUD).          Sexually active: Yes.    The current method of family planning is none.    Exercising: Yes.    walking Smoker:  no  Health Maintenance: Pap:  2016 WNL per patient  History of abnormal Pap:  no MMG:  01-22-15 WNL  Colonoscopy:  Never BMD:   Never TDaP:  Unsure  Gardasil: No   reports that she has never smoked. She has never used smokeless tobacco. She reports that she does not drink alcohol or use drugs. Kids are 24 and 30. 2 sons, oldest will be a Paramedic at [employer], younger will be a Museum/gallery exhibitions officer at [employer].   Past Medical History:  Diagnosis Date  . Dysmenorrhea   . Hyperlipidemia   . Thyroid disease     Past Surgical History:  Procedure Laterality Date  . WISDOM TOOTH EXTRACTION      Current Outpatient Prescriptions  Medication Sig Dispense Refill  . levothyroxine (SYNTHROID, LEVOTHROID) 100 MCG tablet Take 1 tablet (100 mcg total) by mouth daily. 30 tablet 3  . simvastatin (ZOCOR) 20 MG tablet Take 1 tablet (20 mg total) by mouth at bedtime. 90 tablet 1  . Triamcinolone Acetonide (TRIAMCINOLONE 0.1 % CREAM : EUCERIN) CREA Apply 1 application topically 2 (two) times daily as needed. 1 each 3   No current facility-administered medications for this visit.     Family History  Problem Relation Age of Onset  . Cancer Mother        lung  . Heart attack Father   . Diabetes Father   . Hyperlipidemia Father     Review of Systems  Constitutional: Negative.   HENT: Negative.   Eyes: Negative.   Respiratory: Negative.   Cardiovascular: Negative.   Gastrointestinal:  Negative.   Endocrine: Negative.   Genitourinary: Negative.   Musculoskeletal: Negative.   Skin: Negative.   Allergic/Immunologic: Negative.   Neurological: Negative.   Psychiatric/Behavioral: Negative.     Exam:   BP 124/78 (BP Location: Right Arm, Patient Position: Sitting, Cuff Size: Normal)   Pulse 84   Resp 16   Ht 4\' 10"  (1.473 m)   Wt 152 lb (68.9 kg)   BMI 31.77 kg/m   Weight change: @WEIGHTCHANGE @ Height:   Height: 4\' 10"  (147.3 cm)  Ht Readings from Last 3 Encounters:  02/20/17 4\' 10"  (1.473 m)  08/11/16 4\' 10"  (1.473 m)  08/03/16 4\' 11"  (1.499 m)    General appearance: alert, cooperative and appears stated age Head: Normocephalic, without obvious abnormality, atraumatic Neck: no adenopathy, supple, symmetrical, trachea midline and thyroid normal to inspection and palpation Lungs: clear to auscultation bilaterally Cardiovascular: regular rate and rhythm Breasts: normal appearance, no masses or tenderness Abdomen: soft, non-tender; bowel sounds normal; no masses,  no organomegaly Extremities: extremities normal, atraumatic, no cyanosis or edema Skin: Skin color, texture, turgor normal. No rashes or lesions Lymph nodes: Cervical, supraclavicular, and axillary nodes normal. No abnormal inguinal nodes palpated Neurologic: Grossly normal   Pelvic: External genitalia:  no lesions  Urethra:  normal appearing urethra with no masses, tenderness or lesions              Bartholins and Skenes: normal                 Vagina: normal appearing vagina with normal color and discharge, no lesions              Cervix: no lesions, IUD string 2 cm               Bimanual Exam:  Uterus:  normal size, contour, position, consistency, mobility, non-tender and retroverted              Adnexa: no mass, fullness, tenderness               Rectovaginal: Confirms               Anus:  normal sphincter tone, no lesions  Chaperone was present for exam.  A:  Well Woman with  normal exam  IUD check  P:   Pap with hpv  Mammogram  Labs with primary MD  Discussed breast self exam  Discussed calcium and vit D intake  Get a copy of her surgery in the last 2 years.  03/23/17 addendum: Records reviewed In 11/16 she underwent a hysteroscopy, polypectomy, D&C with benign pathology

## 2017-02-20 NOTE — Patient Instructions (Signed)
EXERCISE AND DIET:  We recommended that you start or continue a regular exercise program for good health. Regular exercise means any activity that makes your heart beat faster and makes you sweat.  We recommend exercising at least 30 minutes per day at least 3 days a week, preferably 4 or 5.  We also recommend a diet low in fat and sugar.  Inactivity, poor dietary choices and obesity can cause diabetes, heart attack, stroke, and kidney damage, among others.    ALCOHOL AND SMOKING:  Women should limit their alcohol intake to no more than 7 drinks/beers/glasses of wine (combined, not each!) per week. Moderation of alcohol intake to this level decreases your risk of breast cancer and liver damage. And of course, no recreational drugs are part of a healthy lifestyle.  And absolutely no smoking or even second hand smoke. Most people know smoking can cause heart and lung diseases, but did you know it also contributes to weakening of your bones? Aging of your skin?  Yellowing of your teeth and nails?  CALCIUM AND VITAMIN D:  Adequate intake of calcium and Vitamin D are recommended.  The recommendations for exact amounts of these supplements seem to change often, but generally speaking 600 mg of calcium (either carbonate or citrate) and 800 units of Vitamin D per day seems prudent. Certain women may benefit from higher intake of Vitamin D.  If you are among these women, your doctor will have told you during your visit.    PAP SMEARS:  Pap smears, to check for cervical cancer or precancers,  have traditionally been done yearly, although recent scientific advances have shown that most women can have pap smears less often.  However, every woman still should have a physical exam from her gynecologist every year. It will include a breast check, inspection of the vulva and vagina to check for abnormal growths or skin changes, a visual exam of the cervix, and then an exam to evaluate the size and shape of the uterus and  ovaries.  And after 44 years of age, a rectal exam is indicated to check for rectal cancers. We will also provide age appropriate advice regarding health maintenance, like when you should have certain vaccines, screening for sexually transmitted diseases, bone density testing, colonoscopy, mammograms, etc.   MAMMOGRAMS:  All women over 40 years old should have a yearly mammogram. Many facilities now offer a "3D" mammogram, which may cost around $50 extra out of pocket. If possible,  we recommend you accept the option to have the 3D mammogram performed.  It both reduces the number of women who will be called back for extra views which then turn out to be normal, and it is better than the routine mammogram at detecting truly abnormal areas.    COLONOSCOPY:  Colonoscopy to screen for colon cancer is recommended for all women at age 50.  We know, you hate the idea of the prep.  We agree, BUT, having colon cancer and not knowing it is worse!!  Colon cancer so often starts as a polyp that can be seen and removed at colonscopy, which can quite literally save your life!  And if your first colonoscopy is normal and you have no family history of colon cancer, most women don't have to have it again for 10 years.  Once every ten years, you can do something that may end up saving your life, right?  We will be happy to help you get it scheduled when you are ready.    Be sure to check your insurance coverage so you understand how much it will cost.  It may be covered as a preventative service at no cost, but you should check your particular policy.      Breast Self-Awareness Breast self-awareness means being familiar with how your breasts look and feel. It involves checking your breasts regularly and reporting any changes to your health care provider. Practicing breast self-awareness is important. A change in your breasts can be a sign of a serious medical problem. Being familiar with how your breasts look and feel allows  you to find any problems early, when treatment is more likely to be successful. All women should practice breast self-awareness, including women who have had breast implants. How to do a breast self-exam One way to learn what is normal for your breasts and whether your breasts are changing is to do a breast self-exam. To do a breast self-exam: Look for Changes  1. Remove all the clothing above your waist. 2. Stand in front of a mirror in a room with good lighting. 3. Put your hands on your hips. 4. Push your hands firmly downward. 5. Compare your breasts in the mirror. Look for differences between them (asymmetry), such as: ? Differences in shape. ? Differences in size. ? Puckers, dips, and bumps in one breast and not the other. 6. Look at each breast for changes in your skin, such as: ? Redness. ? Scaly areas. 7. Look for changes in your nipples, such as: ? Discharge. ? Bleeding. ? Dimpling. ? Redness. ? A change in position. Feel for Changes  Carefully feel your breasts for lumps and changes. It is best to do this while lying on your back on the floor and again while sitting or standing in the shower or tub with soapy water on your skin. Feel each breast in the following way:  Place the arm on the side of the breast you are examining above your head.  Feel your breast with the other hand.  Start in the nipple area and make  inch (2 cm) overlapping circles to feel your breast. Use the pads of your three middle fingers to do this. Apply light pressure, then medium pressure, then firm pressure. The light pressure will allow you to feel the tissue closest to the skin. The medium pressure will allow you to feel the tissue that is a little deeper. The firm pressure will allow you to feel the tissue close to the ribs.  Continue the overlapping circles, moving downward over the breast until you feel your ribs below your breast.  Move one finger-width toward the center of the body.  Continue to use the  inch (2 cm) overlapping circles to feel your breast as you move slowly up toward your collarbone.  Continue the up and down exam using all three pressures until you reach your armpit.  Write Down What You Find  Write down what is normal for each breast and any changes that you find. Keep a written record with breast changes or normal findings for each breast. By writing this information down, you do not need to depend only on memory for size, tenderness, or location. Write down where you are in your menstrual cycle, if you are still menstruating. If you are having trouble noticing differences in your breasts, do not get discouraged. With time you will become more familiar with the variations in your breasts and more comfortable with the exam. How often should I examine my breasts? Examine   your breasts every month. If you are breastfeeding, the best time to examine your breasts is after a feeding or after using a breast pump. If you menstruate, the best time to examine your breasts is 5-7 days after your period is over. During your period, your breasts are lumpier, and it may be more difficult to notice changes. When should I see my health care provider? See your health care provider if you notice:  A change in shape or size of your breasts or nipples.  A change in the skin of your breast or nipples, such as a reddened or scaly area.  Unusual discharge from your nipples.  A lump or thick area that was not there before.  Pain in your breasts.  Anything that concerns you.  This information is not intended to replace advice given to you by your health care provider. Make sure you discuss any questions you have with your health care provider. Document Released: 08/08/2005 Document Revised: 01/14/2016 Document Reviewed: 06/28/2015 Elsevier Interactive Patient Education  2018 [employer]  

## 2017-02-23 LAB — CYTOLOGY - PAP
DIAGNOSIS: NEGATIVE
HPV (WINDOPATH): NOT DETECTED

## 2017-03-16 ENCOUNTER — Telehealth: Payer: Self-pay | Admitting: Physician Assistant

## 2017-03-16 ENCOUNTER — Telehealth: Payer: Self-pay | Admitting: Medical

## 2017-03-16 ENCOUNTER — Other Ambulatory Visit: Payer: Self-pay | Admitting: Medical

## 2017-03-16 DIAGNOSIS — Z1231 Encounter for screening mammogram for malignant neoplasm of breast: Secondary | ICD-10-CM

## 2017-03-16 NOTE — Telephone Encounter (Signed)
Ok with me but don't make switch until she acutally comes in to see me. Looks like due to a visit. Schedule early early 8-9 am and advise to come in fasting. Looks like last lipid done in 2017.

## 2017-03-16 NOTE — Telephone Encounter (Signed)
Pt's spouse called in to switch providers.    They would like to switch to provider [facility].     Is this switch okay?     CB: [phone]- I will call spouse back to schedule.

## 2017-03-16 NOTE — Telephone Encounter (Signed)
Ok with me 

## 2017-03-21 ENCOUNTER — Ambulatory Visit (HOSPITAL_BASED_OUTPATIENT_CLINIC_OR_DEPARTMENT_OTHER)
Admission: RE | Admit: 2017-03-21 | Discharge: 2017-03-21 | Disposition: A | Discharge status: Home / Self Care | Payer: BLUE CROSS/BLUE SHIELD | Source: Ambulatory Visit | Attending: Medical | Admitting: Medical

## 2017-03-21 ENCOUNTER — Other Ambulatory Visit: Payer: Self-pay | Admitting: Medical

## 2017-03-21 DIAGNOSIS — Z1231 Encounter for screening mammogram for malignant neoplasm of breast: Secondary | ICD-10-CM

## 2017-03-21 NOTE — Telephone Encounter (Signed)
Patient scheduled with Percell Miller 05/10/17   Saguier, Percell Miller, PA-C routed this conversation to Eliezer Lofts      03/16/17 11:17 PM  Note    Ok with me but don't make switch until she acutally comes in to see me. Looks like due to a visit. Schedule early early 8-9 am and advise to come in fasting. Looks like last lipid done in 2017.

## 2017-03-22 ENCOUNTER — Telehealth: Payer: Self-pay | Admitting: Medical

## 2017-03-22 NOTE — Telephone Encounter (Signed)
Caller name: [place] Relationship to patient: self Can be reached: [phone]  Reason for call: pt called for MM results

## 2017-03-23 NOTE — Telephone Encounter (Signed)
Pt.notified

## 2017-05-10 ENCOUNTER — Ambulatory Visit: Payer: BLUE CROSS/BLUE SHIELD | Admitting: Medical

## 2017-06-20 ENCOUNTER — Telehealth: Payer: Self-pay | Admitting: Medical

## 2017-06-20 ENCOUNTER — Ambulatory Visit (INDEPENDENT_AMBULATORY_CARE_PROVIDER_SITE_OTHER): Payer: BLUE CROSS/BLUE SHIELD | Admitting: Medical

## 2017-06-20 ENCOUNTER — Encounter: Payer: Self-pay | Admitting: Medical

## 2017-06-20 VITALS — BP 138/90 | HR 62 | Temp 97.7°F | Resp 16 | Ht <= 58 in | Wt 155.2 lb

## 2017-06-20 DIAGNOSIS — L989 Disorder of the skin and subcutaneous tissue, unspecified: Secondary | ICD-10-CM | POA: Diagnosis not present

## 2017-06-20 DIAGNOSIS — Z23 Encounter for immunization: Secondary | ICD-10-CM | POA: Diagnosis not present

## 2017-06-20 DIAGNOSIS — R5383 Other fatigue: Secondary | ICD-10-CM | POA: Diagnosis not present

## 2017-06-20 DIAGNOSIS — M79672 Pain in left foot: Secondary | ICD-10-CM

## 2017-06-20 DIAGNOSIS — I10 Essential (primary) hypertension: Secondary | ICD-10-CM

## 2017-06-20 DIAGNOSIS — Z0000 Encounter for general adult medical examination without abnormal findings: Secondary | ICD-10-CM | POA: Diagnosis not present

## 2017-06-20 LAB — CBC
HCT: 40.7 % (ref 36.0–46.0)
Hemoglobin: 13.4 g/dL (ref 12.0–15.0)
MCHC: 32.8 g/dL (ref 30.0–36.0)
MCV: 85.9 fl (ref 78.0–100.0)
PLATELETS: 259 10*3/uL (ref 150.0–400.0)
RBC: 4.74 Mil/uL (ref 3.87–5.11)
RDW: 12.6 % (ref 11.5–15.5)
WBC: 5.8 10*3/uL (ref 4.0–10.5)

## 2017-06-20 LAB — LIPID PANEL
CHOLESTEROL: 133 mg/dL (ref 0–200)
HDL: 42.9 mg/dL (ref >39.00)
LDL Cholesterol: 66 mg/dL (ref 0–99)
NonHDL: 90.42
TRIGLYCERIDES: 120 mg/dL (ref 0.0–149.0)
Total CHOL/HDL Ratio: 3
VLDL: 24 mg/dL (ref 0.0–40.0)

## 2017-06-20 LAB — COMPREHENSIVE METABOLIC PANEL
ALBUMIN: 3.9 g/dL (ref 3.5–5.2)
ALK PHOS: 61 U/L (ref 39–117)
ALT: 27 U/L (ref 0–35)
AST: 19 U/L (ref 0–37)
BILIRUBIN TOTAL: 0.4 mg/dL (ref 0.2–1.2)
BUN: 6 mg/dL (ref 6–23)
CALCIUM: 9.2 mg/dL (ref 8.4–10.5)
CO2: 28 mEq/L (ref 19–32)
CREATININE: 0.56 mg/dL (ref 0.40–1.20)
Chloride: 104 mEq/L (ref 96–112)
GFR: 124.78 mL/min (ref >60.00)
Glucose, Bld: 91 mg/dL (ref 70–99)
Potassium: 4.3 mEq/L (ref 3.5–5.1)
Sodium: 140 mEq/L (ref 135–145)
Total Protein: 7 g/dL (ref 6.0–8.3)

## 2017-06-20 LAB — POC URINALSYSI DIPSTICK (AUTOMATED)
Bilirubin, UA: NEGATIVE
GLUCOSE UA: NEGATIVE
Ketones, UA: NEGATIVE
Leukocytes, UA: NEGATIVE
NITRITE UA: NEGATIVE
Protein, UA: NEGATIVE
RBC UA: NEGATIVE
Spec Grav, UA: <=1.005 — AB (ref 1.010–1.025)
UROBILINOGEN UA: NEGATIVE U/dL — AB
pH, UA: 6 (ref 5.0–8.0)

## 2017-06-20 LAB — TSH: TSH: 1.44 u[IU]/mL (ref 0.35–4.50)

## 2017-06-20 LAB — VITAMIN B12: VITAMIN B 12: 1274 pg/mL — AB (ref 211–911)

## 2017-06-20 MED ORDER — TELMISARTAN-AMLODIPINE 40-5 MG PO TABS
ORAL_TABLET | ORAL | 3 refills | Status: DC
Start: 2017-06-20 — End: 2017-06-30

## 2017-06-20 MED ORDER — SIMVASTATIN 20 MG PO TABS: 20.0000 mg | ORAL_TABLET | Freq: Every day | ORAL | 3 refills | Status: DC

## 2017-06-20 NOTE — Patient Instructions (Addendum)
For you wellness exam today I have ordered cbc, cmp, tsh, lipid panel, ua and hiv.  Vaccine given today flu vaccine  Recommend exercise and healthy diet.  We will let you know lab results as they come in.  Follow up date appointment will be determined after lab review.   Conservative measures for plantar fascitis.(may refer to podiatrist if pain persists)  Refer to derm for skin lesion.   Restart bp medication and check bp daily.    Plantar Fasciitis Plantar fasciitis is a painful foot condition that affects the heel. It occurs when the band of tissue that connects the toes to the heel bone (plantar fascia) becomes irritated. This can happen after exercising too much or doing other repetitive activities (overuse injury). The pain from plantar fasciitis can range from mild irritation to severe pain that makes it difficult for you to walk or move. The pain is usually worse in the morning or after you have been sitting or lying down for a while. What are the causes? This condition may be caused by:  Standing for long periods of time.  Wearing shoes that do not fit.  Doing high-impact activities, including running, aerobics, and ballet.  Being overweight.  Having an abnormal way of walking (gait).  Having tight calf muscles.  Having high arches in your feet.  Starting a new athletic activity.  What are the signs or symptoms? The main symptom of this condition is heel pain. Other symptoms include:  Pain that gets worse after activity or exercise.  Pain that is worse in the morning or after resting.  Pain that goes away after you walk for a few minutes.  How is this diagnosed? This condition may be diagnosed based on your signs and symptoms. Your health care provider will also do a physical exam to check for:  A tender area on the bottom of your foot.  A high arch in your foot.  Pain when you move your foot.  Difficulty moving your foot.  You may also need to have  imaging studies to confirm the diagnosis. These can include:  X-rays.  Ultrasound.  MRI.  How is this treated? Treatment for plantar fasciitis depends on the severity of the condition. Your treatment may include:  Rest, ice, and over-the-counter pain medicines to manage your pain.  Exercises to stretch your calves and your plantar fascia.  A splint that holds your foot in a stretched, upward position while you sleep (night splint).  Physical therapy to relieve symptoms and prevent problems in the future.  Cortisone injections to relieve severe pain.  Extracorporeal shock wave therapy (ESWT) to stimulate damaged plantar fascia with electrical impulses. It is often used as a last resort before surgery.  Surgery, if other treatments have not worked after 12 months.  Follow these instructions at home:  Take medicines only as directed by your health care provider.  Avoid activities that cause pain.  Roll the bottom of your foot over a bag of ice or a bottle of cold water. Do this for 20 minutes, 3-4 times a day.  Perform simple stretches as directed by your health care provider.  Try wearing athletic shoes with air-sole or gel-sole cushions or soft shoe inserts.  Wear a night splint while sleeping, if directed by your health care provider.  Keep all follow-up appointments with your health care provider. How is this prevented?  Do not perform exercises or activities that cause heel pain.  Consider finding low-impact activities if you continue  to have problems.  Lose weight if you need to. The best way to prevent plantar fasciitis is to avoid the activities that aggravate your plantar fascia. Contact a health care provider if:  Your symptoms do not go away after treatment with home care measures.  Your pain gets worse.  Your pain affects your ability to move or do your daily activities. This information is not intended to replace advice given to you by your health care  provider. Make sure you discuss any questions you have with your health care provider. Document Released: 05/03/2001 Document Revised: 01/11/2016 Document Reviewed: 06/18/2014 Elsevier Interactive Patient Education  2018 [place] 18-39 Years, Female Preventive care refers to lifestyle choices and visits with your health care provider that can promote health and wellness. What does preventive care include?  A yearly physical exam. This is also called an annual well check.  Dental exams once or twice a year.  Routine eye exams. Ask your health care provider how often you should have your eyes checked.  Personal lifestyle choices, including: ? Daily care of your teeth and gums. ? Regular physical activity. ? Eating a healthy diet. ? Avoiding tobacco and drug use. ? Limiting alcohol use. ? Practicing safe sex. ? Taking vitamin and mineral supplements as recommended by your health care provider. What happens during an annual well check? The services and screenings done by your health care provider during your annual well check will depend on your age, overall health, lifestyle risk factors, and family history of disease. Counseling Your health care provider may ask you questions about your:  Alcohol use.  Tobacco use.  Drug use.  Emotional well-being.  Home and relationship well-being.  Sexual activity.  Eating habits.  Work and work Statistician.  Method of birth control.  Menstrual cycle.  Pregnancy history.  Screening You may have the following tests or measurements:  Height, weight, and BMI.  Diabetes screening. This is done by checking your blood sugar (glucose) after you have not eaten for a while (fasting).  Blood pressure.  Lipid and cholesterol levels. These may be checked every 5 years starting at age 17.  Skin check.  Hepatitis C blood test.  Hepatitis B blood test.  Sexually transmitted disease (STD)  testing.  BRCA-related cancer screening. This may be done if you have a family history of breast, ovarian, tubal, or peritoneal cancers.  Pelvic exam and Pap test. This may be done every 3 years starting at age 32. Starting at age 18, this may be done every 5 years if you have a Pap test in combination with an HPV test.  Discuss your test results, treatment options, and if necessary, the need for more tests with your health care provider. Vaccines Your health care provider may recommend certain vaccines, such as:  Influenza vaccine. This is recommended every year.  Tetanus, diphtheria, and acellular pertussis (Tdap, Td) vaccine. You may need a Td booster every 10 years.  Varicella vaccine. You may need this if you have not been vaccinated.  HPV vaccine. If you are 100 or younger, you may need three doses over 6 months.  Measles, mumps, and rubella (MMR) vaccine. You may need at least one dose of MMR. You may also need a second dose.  Pneumococcal 13-valent conjugate (PCV13) vaccine. You may need this if you have certain conditions and were not previously vaccinated.  Pneumococcal polysaccharide (PPSV23) vaccine. You may need one or two doses if you smoke cigarettes or if  you have certain conditions.  Meningococcal vaccine. One dose is recommended if you are age 29-21 years and a first-year college student living in a residence hall, or if you have one of several medical conditions. You may also need additional booster doses.  Hepatitis A vaccine. You may need this if you have certain conditions or if you travel or work in places where you may be exposed to hepatitis A.  Hepatitis B vaccine. You may need this if you have certain conditions or if you travel or work in places where you may be exposed to hepatitis B.  Haemophilus influenzae type b (Hib) vaccine. You may need this if you have certain risk factors.  Talk to your health care provider about which screenings and vaccines you  need and how often you need them. This information is not intended to replace advice given to you by your health care provider. Make sure you discuss any questions you have with your health care provider. Document Released: 10/04/2001 Document Revised: 04/27/2016 Document Reviewed: 06/09/2015 Elsevier Interactive Patient Education  2017 [employer].

## 2017-06-20 NOTE — Telephone Encounter (Signed)
Prescription of Zocor/simvastatin sent to patient's pharmacy.

## 2017-06-20 NOTE — Progress Notes (Signed)
Subjective:    Patient ID: Rebekah Knox, female    DOB: 06/13/1971, 46 y.o.   MRN: [id number]  HPI  Pt in for CPE she is fasting.  Pt ok with getting flu vaccine.  Pt will get hiv screen.  Pt given amlodipine for 5 mg a day and telmisartan 40mg  . In [place] her bp 170/120. Pt states her blood increased on vacation eating salty foods.  120-130/80-90 with medication.  Pt also states some heel pain in past summer and some last year. Xray in [place] this summer showed possible spur.  In august 2019 will be due to mammogram.   Review of Systems  Constitutional: Positive for fatigue.  HENT: Negative for congestion, drooling, mouth sores, nosebleeds and postnasal drip.   Respiratory: Negative for cough, choking and wheezing.   Cardiovascular: Negative for chest pain and palpitations.  Gastrointestinal: Negative for abdominal pain.  Musculoskeletal: Negative for back pain.       Heel pain.  Skin: Negative for rash.  Neurological: Negative for dizziness, syncope and headaches.  Hematological: Negative for adenopathy. Does not bruise/bleed easily.  Psychiatric/Behavioral: Negative for behavioral problems, confusion, dysphoric mood and sleep disturbance. The patient is not nervous/anxious.        Mild decreased mood related to her kids both in college.   Past Medical History:  Diagnosis Date  . Dysmenorrhea   . Hyperlipidemia   . Thyroid disease      Social History   Social History  . Marital status: Married    Spouse name: N/A  . Number of children: N/A  . Years of education: N/A   Occupational History  . Not on file.   Social History Main Topics  . Smoking status: Never Smoker  . Smokeless tobacco: Never Used  . Alcohol use No  . Drug use: No  . Sexual activity: Yes    Partners: Male    Birth control/ protection: None   Other Topics Concern  . Not on file   Social History Narrative   Married since 1995   Lives in house, two stories, 6 persons live in home,  no pets   No exercise        Past Surgical History:  Procedure Laterality Date  . WISDOM TOOTH EXTRACTION      Family History  Problem Relation Age of Onset  . Cancer Mother        lung  . Heart attack Father   . Diabetes Father   . Hyperlipidemia Father     Allergies  Allergen Reactions  . Ibuprofen Swelling    Current Outpatient Prescriptions on File Prior to Visit  Medication Sig Dispense Refill  . levothyroxine (SYNTHROID, LEVOTHROID) 100 MCG tablet Take 1 tablet (100 mcg total) by mouth daily. 30 tablet 3  . simvastatin (ZOCOR) 20 MG tablet Take 1 tablet (20 mg total) by mouth at bedtime. 90 tablet 1  . Triamcinolone Acetonide (TRIAMCINOLONE 0.1 % CREAM : EUCERIN) CREA Apply 1 application topically 2 (two) times daily as needed. 1 each 3   No current facility-administered medications on file prior to visit.     BP 125/86   Pulse 62   Temp 97.7 F (36.5 C) (Oral)   Resp 16   Ht 4\' 10"  (1.473 m)   Wt 155 lb 3.2 oz (70.4 kg)   SpO2 100%   BMI 32.44 kg/m       Objective:   Physical Exam  General Mental Status- Alert. General Appearance- Not in acute  distress.     Neck Carotid Arteries- Normal color. Moisture- Normal Moisture. No carotid bruits. No JVD.  Chest and Lung Exam Auscultation: Breath Sounds:-Normal.  Cardiovascular Auscultation:Rythm- Regular. Murmurs & Other Heart Sounds:Auscultation of the heart reveals- No Murmurs.  Abdomen Inspection:-Inspeection Normal. Palpation/Percussion:Note:No mass. Palpation and Percussion of the abdomen reveal- Non Tender, Non Distended + BS, no rebound or guarding.   Neurologic Cranial Nerve exam:- CN III-XII intact(No nystagmus), symmetric smile. tStrength:- 5/5 equal and symmetric strength both upper and lower extremities.  Left foot- mild heel pain.  Skin- large pedunculated skin lesion. Scattered small skin tags.       Assessment & Plan:  For you wellness exam today I have ordered cbc, cmp,  tsh, lipid panel, ua and hiv.  Vaccine given today flu vaccine  Recommend exercise and healthy diet.  We will let you know lab results as they come in.  Follow up date appointment will be determined after lab review.   Conservative measures for plantar fascitis.(may refer to podiatrist if pain persists)  Refer to derm for skin lesion.   Restart bp medication and check bp daily.   Ronnetta Currington, Percell Miller, PA-C

## 2017-06-25 LAB — VITAMIN D 1,25 DIHYDROXY
Vitamin D 1, 25 (OH)2 Total: 82 pg/mL — ABNORMAL HIGH (ref 18–72)
Vitamin D3 1, 25 (OH)2: 82 pg/mL

## 2017-06-27 ENCOUNTER — Telehealth: Payer: Self-pay | Admitting: Medical

## 2017-06-27 NOTE — Telephone Encounter (Signed)
Patient states that high blood pressure med is not covered by her insurance. Patient is requesting an alternate or generic.

## 2017-06-28 MED ORDER — AMLODIPINE BESYLATE 5 MG PO TABS: 5.0000 mg | ORAL_TABLET | Freq: Every day | ORAL | 2 refills | Status: DC

## 2017-06-28 MED ORDER — TELMISARTAN 40 MG PO TABS: 40.0000 mg | ORAL_TABLET | Freq: Every day | ORAL | 2 refills | Status: DC

## 2017-06-30 ENCOUNTER — Telehealth: Payer: Self-pay | Admitting: Medical

## 2017-06-30 NOTE — Telephone Encounter (Signed)
I saw that Rebekah Knox filled pt micardis and amlodipine. Old note from gyn states pt has iud as well.

## 2018-03-14 ENCOUNTER — Other Ambulatory Visit: Payer: Self-pay | Admitting: Medical

## 2018-03-14 DIAGNOSIS — Z1231 Encounter for screening mammogram for malignant neoplasm of breast: Secondary | ICD-10-CM

## 2018-03-27 ENCOUNTER — Ambulatory Visit (HOSPITAL_BASED_OUTPATIENT_CLINIC_OR_DEPARTMENT_OTHER)
Admission: RE | Admit: 2018-03-27 | Discharge: 2018-03-27 | Disposition: A | Discharge status: Home / Self Care | Payer: BLUE CROSS/BLUE SHIELD | Source: Ambulatory Visit | Attending: Medical | Admitting: Medical

## 2018-03-27 DIAGNOSIS — Z1231 Encounter for screening mammogram for malignant neoplasm of breast: Secondary | ICD-10-CM | POA: Insufficient documentation

## 2018-05-31 ENCOUNTER — Telehealth: Payer: Self-pay | Admitting: Medical

## 2018-05-31 ENCOUNTER — Encounter: Payer: Self-pay | Admitting: Medical

## 2018-05-31 ENCOUNTER — Ambulatory Visit (INDEPENDENT_AMBULATORY_CARE_PROVIDER_SITE_OTHER): Payer: BLUE CROSS/BLUE SHIELD | Admitting: Medical

## 2018-05-31 VITALS — BP 143/85 | HR 63 | Temp 98.1°F | Resp 16 | Ht <= 58 in | Wt 154.4 lb

## 2018-05-31 DIAGNOSIS — E039 Hypothyroidism, unspecified: Secondary | ICD-10-CM

## 2018-05-31 DIAGNOSIS — R319 Hematuria, unspecified: Secondary | ICD-10-CM

## 2018-05-31 DIAGNOSIS — R2231 Localized swelling, mass and lump, right upper limb: Secondary | ICD-10-CM

## 2018-05-31 DIAGNOSIS — Z113 Encounter for screening for infections with a predominantly sexual mode of transmission: Secondary | ICD-10-CM

## 2018-05-31 DIAGNOSIS — Z23 Encounter for immunization: Secondary | ICD-10-CM

## 2018-05-31 DIAGNOSIS — L989 Disorder of the skin and subcutaneous tissue, unspecified: Secondary | ICD-10-CM

## 2018-05-31 DIAGNOSIS — Z0000 Encounter for general adult medical examination without abnormal findings: Secondary | ICD-10-CM | POA: Diagnosis not present

## 2018-05-31 LAB — CBC WITH DIFFERENTIAL/PLATELET
BASOS PCT: 0.2 % (ref 0.0–3.0)
Basophils Absolute: 0 10*3/uL (ref 0.0–0.1)
Eosinophils Absolute: 0.1 10*3/uL (ref 0.0–0.7)
Eosinophils Relative: 1 % (ref 0.0–5.0)
HEMATOCRIT: 40 % (ref 36.0–46.0)
Hemoglobin: 13.3 g/dL (ref 12.0–15.0)
LYMPHS ABS: 1.8 10*3/uL (ref 0.7–4.0)
LYMPHS PCT: 32.9 % (ref 12.0–46.0)
MCHC: 33.2 g/dL (ref 30.0–36.0)
MCV: 85.6 fl (ref 78.0–100.0)
MONOS PCT: 8 % (ref 3.0–12.0)
Monocytes Absolute: 0.4 10*3/uL (ref 0.1–1.0)
NEUTROS ABS: 3.2 10*3/uL (ref 1.4–7.7)
NEUTROS PCT: 57.9 % (ref 43.0–77.0)
PLATELETS: 233 10*3/uL (ref 150.0–400.0)
RBC: 4.67 Mil/uL (ref 3.87–5.11)
RDW: 13.5 % (ref 11.5–15.5)
WBC: 5.5 10*3/uL (ref 4.0–10.5)

## 2018-05-31 LAB — LIPID PANEL
Cholesterol: 176 mg/dL (ref 0–200)
HDL: 48.2 mg/dL (ref >39.00)
LDL CALC: 102 mg/dL — AB (ref 0–99)
NonHDL: 128.1
TRIGLYCERIDES: 129 mg/dL (ref 0.0–149.0)
Total CHOL/HDL Ratio: 4
VLDL: 25.8 mg/dL (ref 0.0–40.0)

## 2018-05-31 LAB — COMPREHENSIVE METABOLIC PANEL
ALT: 22 U/L (ref 0–35)
AST: 16 U/L (ref 0–37)
Albumin: 3.7 g/dL (ref 3.5–5.2)
Alkaline Phosphatase: 49 U/L (ref 39–117)
BUN: 6 mg/dL (ref 6–23)
CHLORIDE: 105 meq/L (ref 96–112)
CO2: 26 meq/L (ref 19–32)
Calcium: 8.6 mg/dL (ref 8.4–10.5)
Creatinine, Ser: 0.67 mg/dL (ref 0.40–1.20)
GFR: 101.02 mL/min (ref >60.00)
GLUCOSE: 87 mg/dL (ref 70–99)
POTASSIUM: 3.8 meq/L (ref 3.5–5.1)
Sodium: 137 mEq/L (ref 135–145)
Total Bilirubin: 0.4 mg/dL (ref 0.2–1.2)
Total Protein: 6.6 g/dL (ref 6.0–8.3)

## 2018-05-31 LAB — POC URINALSYSI DIPSTICK (AUTOMATED)
Bilirubin, UA: NEGATIVE
Glucose, UA: NEGATIVE
Ketones, UA: NEGATIVE
LEUKOCYTES UA: NEGATIVE
NITRITE UA: NEGATIVE
PH UA: 6 (ref 5.0–8.0)
PROTEIN UA: NEGATIVE
Spec Grav, UA: 1.025 (ref 1.010–1.025)
Urobilinogen, UA: NEGATIVE E.U./dL — AB

## 2018-05-31 LAB — TSH: TSH: 1.97 u[IU]/mL (ref 0.35–4.50)

## 2018-05-31 MED ORDER — SIMVASTATIN 20 MG PO TABS: 20.0000 mg | ORAL_TABLET | Freq: Every day | ORAL | 3 refills | Status: DC

## 2018-05-31 MED ORDER — TELMISARTAN 40 MG PO TABS: 40.0000 mg | ORAL_TABLET | Freq: Every day | ORAL | 2 refills | Status: DC

## 2018-05-31 MED ORDER — LEVOTHYROXINE SODIUM 100 MCG PO TABS: 100.0000 ug | ORAL_TABLET | Freq: Every day | ORAL | 3 refills | Status: DC

## 2018-05-31 NOTE — Progress Notes (Signed)
Subjective:    Patient ID: Rebekah Knox, female    DOB: [birth year], 47 y.o.   MRN: [id number]  HPI Pt in today for CPE. She is fasting.  Pt got flu vaccine today. Pt states regularly walking 2-3 miles a day. Pt state eating healthy.   Pt states last week did not take bp medicine last week.   LMP- 05-19-2018.  Pt will get tdap.    Review of Systems  Constitutional: Negative for chills, fatigue and fever.  HENT: Negative for congestion, drooling and ear discharge.   Eyes: Negative for pain, redness and visual disturbance.  Respiratory: Negative for cough, chest tightness, shortness of breath and wheezing.   Cardiovascular: Negative for chest pain.  Gastrointestinal: Negative for abdominal pain.  Musculoskeletal:       Rt 2nd digit medial aspect raised area that came up one month ago. Getting larger but no pain.  Skin: Negative for rash.  Neurological: Negative for dizziness, weakness and headaches.  Hematological: Negative for adenopathy. Does not bruise/bleed easily.  Psychiatric/Behavioral: Negative for agitation, confusion and sleep disturbance. The patient is not nervous/anxious.     Past Medical History:  Diagnosis Date  . Dysmenorrhea   . Hyperlipidemia   . Thyroid disease      Social History   Socioeconomic History  . Marital status: Married    Spouse name: Not on file  . Number of children: Not on file  . Years of education: Not on file  . Highest education level: Not on file  Occupational History  . Not on file  Social Needs  . Financial resource strain: Not on file  . Food insecurity:    Worry: Not on file    Inability: Not on file  . Transportation needs:    Medical: Not on file    Non-medical: Not on file  Tobacco Use  . Smoking status: Never Smoker  . Smokeless tobacco: Never Used  Substance and Sexual Activity  . Alcohol use: No    Alcohol/week: 0.0 standard drinks  . Drug use: No  . Sexual activity: Yes    Partners: Male    Birth  control/protection: None  Lifestyle  . Physical activity:    Days per week: Not on file    Minutes per session: Not on file  . Stress: Not on file  Relationships  . Social connections:    Talks on phone: Not on file    Gets together: Not on file    Attends religious service: Not on file    Active member of club or organization: Not on file    Attends meetings of clubs or organizations: Not on file    Relationship status: Not on file  . Intimate partner violence:    Fear of current or ex partner: Not on file    Emotionally abused: Not on file    Physically abused: Not on file    Forced sexual activity: Not on file  Other Topics Concern  . Not on file  Social History Narrative   Married since 1995   Lives in house, two stories, 6 persons live in home, no pets   No exercise     Past Surgical History:  Procedure Laterality Date  . WISDOM TOOTH EXTRACTION      Family History  Problem Relation Age of Onset  . Cancer Mother        lung  . Heart attack Father   . Diabetes Father   . Hyperlipidemia Father  Allergies  Allergen Reactions  . Ibuprofen Swelling    Current Outpatient Medications on File Prior to Visit  Medication Sig Dispense Refill  . amLODipine (NORVASC) 5 MG tablet Take 1 tablet (5 mg total) daily by mouth. 30 tablet 2  . levothyroxine (SYNTHROID, LEVOTHROID) 100 MCG tablet Take 1 tablet (100 mcg total) by mouth daily. 30 tablet 3  . simvastatin (ZOCOR) 20 MG tablet Take 1 tablet (20 mg total) by mouth at bedtime. 90 tablet 3  . telmisartan (MICARDIS) 40 MG tablet Take 1 tablet (40 mg total) daily by mouth. 30 tablet 2  . Triamcinolone Acetonide (TRIAMCINOLONE 0.1 % CREAM : EUCERIN) CREA Apply 1 application topically 2 (two) times daily as needed. 1 each 3   No current facility-administered medications on file prior to visit.     BP (!) 146/93   Pulse 63   Temp 98.1 F (36.7 C) (Oral)   Resp 16   Ht 4\' 10"  (1.473 m)   Wt 154 lb 6.4 oz (70 kg)    SpO2 100%   BMI 32.27 kg/m       Objective:   Physical Exam  General Mental Status- Alert. General Appearance- Not in acute distress.   Skin General: Color- Normal Color. Moisture- Normal Moisture.  Neck Carotid Arteries- Normal color. Moisture- Normal Moisture. No carotid bruits. No JVD.  Chest and Lung Exam Auscultation: Breath Sounds:-Normal.  Cardiovascular Auscultation:Rythm- Regular. Murmurs & Other Heart Sounds:Auscultation of the heart reveals- No Murmurs.  Abdomen Inspection:-Inspeection Normal. Palpation/Percussion:Note:No mass. Palpation and Percussion of the abdomen reveal- Non Tender, Non Distended + BS, no rebound or guarding.    Neurologic Cranial Nerve exam:- CN III-XII intact(No nystagmus), symmetric smile. Strength:- 5/5 equal and symmetric strength both upper and lower extremities.  Rt hand-on second digit between mcp and pip raised hard area. Feels like bony protuberance.  Derm- rt flank. Large cirucular(10 mm) shaped pedunculated lesion.      Assessment & Plan:  For you wellness exam today I have ordered cbc, cmp, tsh, lipid panel, ua and hiv.  Vaccine given today flu and tdap today.  Recommend exercise and healthy diet.  We will let you know lab results as they come in.  Follow up date appointment will be determined after lab review.   Will get xray of rt 2nd digit and see what that shows.  Also check bp daily and my chart me in one week with bp readings.  Mackie Pai, PA-C

## 2018-05-31 NOTE — Telephone Encounter (Signed)
Pt sent in both levothyroid and zocor rx.

## 2018-05-31 NOTE — Telephone Encounter (Signed)
Future urine placed for hematuria.

## 2018-05-31 NOTE — Patient Instructions (Addendum)
For you wellness exam today I have ordered cbc, cmp, tsh, lipid panel, ua and hiv.  Vaccine given today flu and tdap today.  Recommend exercise and healthy diet.  We will let you know lab results as they come in.  Follow up date appointment will be determined after lab review.   Will get xray of rt 2nd digit and see what that shows.  Also check bp daily and my chart me in one week with bp readings.  Will refer you to dermatologist to remove and evaluate rt side/flank lesion.   Preventive Care 40-64 Years, Female Preventive care refers to lifestyle choices and visits with your health care provider that can promote health and wellness. What does preventive care include?  A yearly physical exam. This is also called an annual well check.  Dental exams once or twice a year.  Routine eye exams. Ask your health care provider how often you should have your eyes checked.  Personal lifestyle choices, including: ? Daily care of your teeth and gums. ? Regular physical activity. ? Eating a healthy diet. ? Avoiding tobacco and drug use. ? Limiting alcohol use. ? Practicing safe sex. ? Taking low-dose aspirin daily starting at age 27. ? Taking vitamin and mineral supplements as recommended by your health care provider. What happens during an annual well check? The services and screenings done by your health care provider during your annual well check will depend on your age, overall health, lifestyle risk factors, and family history of disease. Counseling Your health care provider may ask you questions about your:  Alcohol use.  Tobacco use.  Drug use.  Emotional well-being.  Home and relationship well-being.  Sexual activity.  Eating habits.  Work and work Statistician.  Method of birth control.  Menstrual cycle.  Pregnancy history.  Screening You may have the following tests or measurements:  Height, weight, and BMI.  Blood pressure.  Lipid and cholesterol  levels. These may be checked every 5 years, or more frequently if you are over 67 years old.  Skin check.  Lung cancer screening. You may have this screening every year starting at age 59 if you have a 30-pack-year history of smoking and currently smoke or have quit within the past 15 years.  Fecal occult blood test (FOBT) of the stool. You may have this test every year starting at age 19.  Flexible sigmoidoscopy or colonoscopy. You may have a sigmoidoscopy every 5 years or a colonoscopy every 10 years starting at age 68.  Hepatitis C blood test.  Hepatitis B blood test.  Sexually transmitted disease (STD) testing.  Diabetes screening. This is done by checking your blood sugar (glucose) after you have not eaten for a while (fasting). You may have this done every 1-3 years.  Mammogram. This may be done every 1-2 years. Talk to your health care provider about when you should start having regular mammograms. This may depend on whether you have a family history of breast cancer.  BRCA-related cancer screening. This may be done if you have a family history of breast, ovarian, tubal, or peritoneal cancers.  Pelvic exam and Pap test. This may be done every 3 years starting at age 4. Starting at age 36, this may be done every 5 years if you have a Pap test in combination with an HPV test.  Bone density scan. This is done to screen for osteoporosis. You may have this scan if you are at high risk for osteoporosis.  Discuss your test  results, treatment options, and if necessary, the need for more tests with your health care provider. Vaccines Your health care provider may recommend certain vaccines, such as:  Influenza vaccine. This is recommended every year.  Tetanus, diphtheria, and acellular pertussis (Tdap, Td) vaccine. You may need a Td booster every 10 years.  Varicella vaccine. You may need this if you have not been vaccinated.  Zoster vaccine. You may need this after age  54.  Measles, mumps, and rubella (MMR) vaccine. You may need at least one dose of MMR if you were born in 1957 or later. You may also need a second dose.  Pneumococcal 13-valent conjugate (PCV13) vaccine. You may need this if you have certain conditions and were not previously vaccinated.  Pneumococcal polysaccharide (PPSV23) vaccine. You may need one or two doses if you smoke cigarettes or if you have certain conditions.  Meningococcal vaccine. You may need this if you have certain conditions.  Hepatitis A vaccine. You may need this if you have certain conditions or if you travel or work in places where you may be exposed to hepatitis A.  Hepatitis B vaccine. You may need this if you have certain conditions or if you travel or work in places where you may be exposed to hepatitis B.  Haemophilus influenzae type b (Hib) vaccine. You may need this if you have certain conditions.  Talk to your health care provider about which screenings and vaccines you need and how often you need them. This information is not intended to replace advice given to you by your health care provider. Make sure you discuss any questions you have with your health care provider. Document Released: 09/04/2015 Document Revised: 04/27/2016 Document Reviewed: 06/09/2015 Elsevier Interactive Patient Education  [employer].

## 2018-06-01 ENCOUNTER — Encounter: Payer: Self-pay | Admitting: Medical

## 2018-06-01 ENCOUNTER — Telehealth: Payer: Self-pay

## 2018-06-01 LAB — URINE CULTURE
MICRO NUMBER: 91220073
Result:: NO GROWTH
SPECIMEN QUALITY: ADEQUATE

## 2018-06-01 LAB — HIV ANTIBODY (ROUTINE TESTING W REFLEX): HIV 1&2 Ab, 4th Generation: NONREACTIVE

## 2018-06-01 NOTE — Telephone Encounter (Signed)
Author phoned pt. to set up lab appointment for repeat UA/cx per PCP. Author reiterated HIV result, and reviewed all labs, and pt. appreciative. Pt. wanted to know if her repeat UA/cx would be covered by her insurance, and author advised pt. to call her insurance company to ask. Pt. stated she would and if they do, she would call to make that lab appointment. Routed to PCP as FYI.

## 2018-06-01 NOTE — Telephone Encounter (Signed)
-----   Message from Mackie Pai, PA-C sent at 06/01/2018 12:39 PM EDT ----- hiv screen test was negative.

## 2018-06-06 ENCOUNTER — Encounter: Payer: Self-pay | Admitting: Medical

## 2018-06-08 ENCOUNTER — Encounter: Payer: Self-pay | Admitting: Medical

## 2018-06-11 ENCOUNTER — Telehealth: Payer: Self-pay | Admitting: *Deleted

## 2018-06-11 ENCOUNTER — Other Ambulatory Visit: Payer: Self-pay | Admitting: Medical

## 2018-06-11 NOTE — Telephone Encounter (Signed)
Copied from [place] [phone]. Topic: General - Other >> Jun 11, 2018  2:28 PM Janace Aris A wrote: Medication: amLODipine (NORVASC) 5 MG tablet   Has the patient contacted their pharmacy? Yes  Preferred Pharmacy (with phone number or street name): [place], [place]  [phone] (Phone) [phone] (Fax)    Agent: Please be advised that RX refills may take up to 3 business days. We ask that you follow-up with your pharmacy.

## 2018-06-11 NOTE — Telephone Encounter (Signed)
Received Medical records from [facility]; forwarded to provider/SLS 10/21

## 2018-06-12 ENCOUNTER — Encounter: Payer: BLUE CROSS/BLUE SHIELD | Admitting: Advanced Practice Midwife

## 2018-06-12 MED ORDER — AMLODIPINE BESYLATE 5 MG PO TABS: 5.0000 mg | ORAL_TABLET | Freq: Every day | ORAL | 1 refills | Status: DC

## 2018-06-12 NOTE — Telephone Encounter (Signed)
Requested Prescriptions  Pending Prescriptions Disp Refills  . amLODipine (NORVASC) 5 MG tablet 90 tablet 1    Sig: Take 1 tablet (5 mg total) by mouth daily.     Cardiovascular:  Calcium Channel Blockers Failed - 06/11/2018  4:00 PM      Failed - Last BP in normal range    BP Readings from Last 1 Encounters:  05/31/18 (!) 143/85         Passed - Valid encounter within last 6 months    Recent Outpatient Visits          1 week ago Wellness examination   [facility] [place] [place], [place]   11 months ago Wellness examination   [facility] [place], PA-C   1 year ago Hyperlipidemia, unspecified hyperlipidemia type   [facility] [place], PA-C   1 year ago Acute thoracic back pain, unspecified back pain laterality   [facility] [place], [place]   2 years ago Visit for preventive health examination   [facility] at [place], [place]           BP's reviewed in [place] communication from pt. On 06/06/18; per provider, to continue same regime.

## 2018-06-19 ENCOUNTER — Ambulatory Visit (HOSPITAL_BASED_OUTPATIENT_CLINIC_OR_DEPARTMENT_OTHER)
Admission: RE | Admit: 2018-06-19 | Discharge: 2018-06-19 | Disposition: A | Discharge status: Home / Self Care | Payer: BLUE CROSS/BLUE SHIELD | Source: Ambulatory Visit | Attending: Medical | Admitting: Medical

## 2018-06-19 DIAGNOSIS — R2231 Localized swelling, mass and lump, right upper limb: Secondary | ICD-10-CM | POA: Insufficient documentation

## 2018-06-20 ENCOUNTER — Telehealth: Payer: Self-pay | Admitting: Medical

## 2018-06-20 ENCOUNTER — Encounter: Payer: Self-pay | Admitting: Medical

## 2018-06-20 DIAGNOSIS — Z9289 Personal history of other medical treatment: Secondary | ICD-10-CM

## 2018-06-20 DIAGNOSIS — M67449 Ganglion, unspecified hand: Secondary | ICD-10-CM

## 2018-06-20 NOTE — Telephone Encounter (Signed)
Referral to hand specialist placed.

## 2018-06-20 NOTE — Telephone Encounter (Signed)
cxr placed. 

## 2018-06-22 ENCOUNTER — Ambulatory Visit (HOSPITAL_BASED_OUTPATIENT_CLINIC_OR_DEPARTMENT_OTHER)
Admission: RE | Admit: 2018-06-22 | Discharge: 2018-06-22 | Disposition: A | Discharge status: Home / Self Care | Payer: BLUE CROSS/BLUE SHIELD | Source: Ambulatory Visit | Attending: Medical | Admitting: Medical

## 2018-06-22 ENCOUNTER — Encounter: Payer: BLUE CROSS/BLUE SHIELD | Admitting: Medical

## 2018-06-22 DIAGNOSIS — Z9289 Personal history of other medical treatment: Secondary | ICD-10-CM | POA: Diagnosis not present

## 2018-06-26 ENCOUNTER — Encounter: Payer: Self-pay | Admitting: Advanced Practice Midwife

## 2018-06-26 ENCOUNTER — Ambulatory Visit (HOSPITAL_BASED_OUTPATIENT_CLINIC_OR_DEPARTMENT_OTHER)
Admission: RE | Admit: 2018-06-26 | Discharge: 2018-06-26 | Disposition: A | Discharge status: Home / Self Care | Payer: BLUE CROSS/BLUE SHIELD | Source: Ambulatory Visit | Attending: Advanced Practice Midwife | Admitting: Advanced Practice Midwife

## 2018-06-26 ENCOUNTER — Ambulatory Visit (INDEPENDENT_AMBULATORY_CARE_PROVIDER_SITE_OTHER): Payer: BLUE CROSS/BLUE SHIELD | Admitting: Advanced Practice Midwife

## 2018-06-26 VITALS — BP 127/91 | HR 74 | Ht <= 58 in | Wt 154.1 lb

## 2018-06-26 DIAGNOSIS — B9689 Other specified bacterial agents as the cause of diseases classified elsewhere: Secondary | ICD-10-CM | POA: Insufficient documentation

## 2018-06-26 DIAGNOSIS — Z975 Presence of (intrauterine) contraceptive device: Secondary | ICD-10-CM | POA: Insufficient documentation

## 2018-06-26 DIAGNOSIS — Z01419 Encounter for gynecological examination (general) (routine) without abnormal findings: Secondary | ICD-10-CM

## 2018-06-26 DIAGNOSIS — N7011 Chronic salpingitis: Secondary | ICD-10-CM | POA: Insufficient documentation

## 2018-06-26 DIAGNOSIS — N939 Abnormal uterine and vaginal bleeding, unspecified: Secondary | ICD-10-CM | POA: Diagnosis not present

## 2018-06-26 DIAGNOSIS — Z124 Encounter for screening for malignant neoplasm of cervix: Secondary | ICD-10-CM | POA: Diagnosis not present

## 2018-06-26 DIAGNOSIS — Z1151 Encounter for screening for human papillomavirus (HPV): Secondary | ICD-10-CM | POA: Diagnosis not present

## 2018-06-26 NOTE — Patient Instructions (Signed)
Levonorgestrel intrauterine device (IUD) What is this medicine? LEVONORGESTREL IUD (LEE voe nor jes trel) is a contraceptive (birth control) device. The device is placed inside the uterus by a healthcare professional. It is used to prevent pregnancy. This device can also be used to treat heavy bleeding that occurs during your period. This medicine may be used for other purposes; ask your health care provider or pharmacist if you have questions. COMMON BRAND NAME(S): Kyleena, LILETTA, Mirena, Skyla What should I tell my health care provider before I take this medicine? They need to know if you have any of these conditions: -abnormal Pap smear -cancer of the breast, uterus, or cervix -diabetes -endometritis -genital or pelvic infection now or in the past -have more than one sexual partner or your partner has more than one partner -heart disease -history of an ectopic or tubal pregnancy -immune system problems -IUD in place -liver disease or tumor -problems with blood clots or take blood-thinners -seizures -use intravenous drugs -uterus of unusual shape -vaginal bleeding that has not been explained -an unusual or allergic reaction to levonorgestrel, other hormones, silicone, or polyethylene, medicines, foods, dyes, or preservatives -pregnant or trying to get pregnant -breast-feeding How should I use this medicine? This device is placed inside the uterus by a health care professional. Talk to your pediatrician regarding the use of this medicine in children. Special care may be needed. Overdosage: If you think you have taken too much of this medicine contact a poison control center or emergency room at once. NOTE: This medicine is only for you. Do not share this medicine with others. What if I miss a dose? This does not apply. Depending on the brand of device you have inserted, the device will need to be replaced every 3 to 5 years if you wish to continue using this type of birth  control. What may interact with this medicine? Do not take this medicine with any of the following medications: -amprenavir -bosentan -fosamprenavir This medicine may also interact with the following medications: -aprepitant -armodafinil -barbiturate medicines for inducing sleep or treating seizures -bexarotene -boceprevir -griseofulvin -medicines to treat seizures like carbamazepine, ethotoin, felbamate, oxcarbazepine, phenytoin, topiramate -modafinil -pioglitazone -rifabutin -rifampin -rifapentine -some medicines to treat HIV infection like atazanavir, efavirenz, indinavir, lopinavir, nelfinavir, tipranavir, ritonavir -St. John's wort -warfarin This list may not describe all possible interactions. Give your health care provider a list of all the medicines, herbs, non-prescription drugs, or dietary supplements you use. Also tell them if you smoke, drink alcohol, or use illegal drugs. Some items may interact with your medicine. What should I watch for while using this medicine? Visit your doctor or health care professional for regular check ups. See your doctor if you or your partner has sexual contact with others, becomes HIV positive, or gets a sexual transmitted disease. This product does not protect you against HIV infection (AIDS) or other sexually transmitted diseases. You can check the placement of the IUD yourself by reaching up to the top of your vagina with clean fingers to feel the threads. Do not pull on the threads. It is a good habit to check placement after each menstrual period. Call your doctor right away if you feel more of the IUD than just the threads or if you cannot feel the threads at all. The IUD may come out by itself. You may become pregnant if the device comes out. If you notice that the IUD has come out use a backup birth control method like condoms and call your   health care provider. Using tampons will not change the position of the IUD and are okay to use  during your period. This IUD can be safely scanned with magnetic resonance imaging (MRI) only under specific conditions. Before you have an MRI, tell your healthcare provider that you have an IUD in place, and which type of IUD you have in place. What side effects may I notice from receiving this medicine? Side effects that you should report to your doctor or health care professional as soon as possible: -allergic reactions like skin rash, itching or hives, swelling of the face, lips, or tongue -fever, flu-like symptoms -genital sores -high blood pressure -no menstrual period for 6 weeks during use -pain, swelling, warmth in the leg -pelvic pain or tenderness -severe or sudden headache -signs of pregnancy -stomach cramping -sudden shortness of breath -trouble with balance, talking, or walking -unusual vaginal bleeding, discharge -yellowing of the eyes or skin Side effects that usually do not require medical attention (report to your doctor or health care professional if they continue or are bothersome): -acne -breast pain -change in sex drive or performance -changes in weight -cramping, dizziness, or faintness while the device is being inserted -headache -irregular menstrual bleeding within first 3 to 6 months of use -nausea This list may not describe all possible side effects. Call your doctor for medical advice about side effects. You may report side effects to FDA at 1-800-FDA-1088. Where should I keep my medicine? This does not apply. NOTE: This sheet is a summary. It may not cover all possible information. If you have questions about this medicine, talk to your doctor, pharmacist, or health care provider.  2018 Elsevier/Gold Standard (2016-05-20 14:14:56) Abnormal Uterine Bleeding Abnormal uterine bleeding means bleeding more than usual from your uterus. It can include:  Bleeding between periods.  Bleeding after sex.  Bleeding that is heavier than normal.  Periods that  last longer than usual.  Bleeding after you have stopped having your period (menopause).  There are many problems that may cause this. You should see a doctor for any kind of bleeding that is not normal. Treatment depends on the cause of the bleeding. Follow these instructions at home:  Watch your condition for any changes.  Do not use tampons, douche, or have sex, if your doctor tells you not to.  Change your pads often.  Get regular well-woman exams. Make sure they include a pelvic exam and cervical cancer screening.  Keep all follow-up visits as told by your doctor. This is important. Contact a doctor if:  The bleeding lasts more than one week.  You feel dizzy at times.  You feel like you are going to throw up (nauseous).  You throw up. Get help right away if:  You pass out.  You have to change pads every hour.  You have belly (abdominal) pain.  You have a fever.  You get sweaty.  You get weak.  You passing large blood clots from your vagina. Summary  Abnormal uterine bleeding means bleeding more than usual from your uterus.  There are many problems that may cause this. You should see a doctor for any kind of bleeding that is not normal.  Treatment depends on the cause of the bleeding. This information is not intended to replace advice given to you by your health care provider. Make sure you discuss any questions you have with your health care provider. Document Released: 06/05/2009 Document Revised: 08/02/2016 Document Reviewed: 08/02/2016 Elsevier Interactive Patient Education  2017 Elsevier  Inc.  

## 2018-06-26 NOTE — Progress Notes (Signed)
Patient ID: Rebekah Knox, female   DOB: October 19, 1970, 47 y.o.   MRN: [id number] GYNECOLOGY ANNUAL PREVENTATIVE CARE ENCOUNTER NOTE  Subjective:   Rebekah Knox is a 47 y.o. G25P2002 female here for a routine annual gynecologic exam.  Current complaints: irregular bleeding last 2 months.  Not heavy, spotting for 3 weeks last  cycle.   Denies abnormal vaginal bleeding, discharge, pelvic pain, problems with intercourse or other gynecologic concerns.   Has a history of endometrial polyp removed in 2016.  Had Mirena IUD placed then.   Sees primary doctor for medical issues.  Thyroid levels WNL on meds   Gynecologic History Patient's last menstrual period was 05/28/2018. Contraception: IUD Last Pap: 2018. Results were: normal Last mammogram: 03/27/18. Results were: normal  Obstetric History OB History  Gravida Para Term Preterm AB Living  2 2 2     2   SAB TAB Ectopic Multiple Live Births          2    # Outcome Date GA Lbr Len/2nd Weight Sex Delivery Anes PTL Lv  2 Term      Vag-Spont   LIV  1 Term      Vag-Spont   LIV    Past Medical History:  Diagnosis Date  . Dysmenorrhea   . Hyperlipidemia   . Thyroid disease     Past Surgical History:  Procedure Laterality Date  . WISDOM TOOTH EXTRACTION      Current Outpatient Medications on File Prior to Visit  Medication Sig Dispense Refill  . amLODipine (NORVASC) 5 MG tablet Take 1 tablet (5 mg total) by mouth daily. 90 tablet 1  . levothyroxine (SYNTHROID, LEVOTHROID) 100 MCG tablet Take 1 tablet (100 mcg total) by mouth daily. 30 tablet 3  . simvastatin (ZOCOR) 20 MG tablet Take 1 tablet (20 mg total) by mouth at bedtime. 90 tablet 3  . telmisartan (MICARDIS) 40 MG tablet Take 1 tablet (40 mg total) by mouth daily. 30 tablet 2  . Triamcinolone Acetonide (TRIAMCINOLONE 0.1 % CREAM : EUCERIN) CREA Apply 1 application topically 2 (two) times daily as needed. 1 each 3   No current facility-administered medications on file prior to  visit.     Allergies  Allergen Reactions  . Ibuprofen Swelling    Social History   Socioeconomic History  . Marital status: Married    Spouse name: Not on file  . Number of children: Not on file  . Years of education: Not on file  . Highest education level: Not on file  Occupational History  . Not on file  Social Needs  . Financial resource strain: Not on file  . Food insecurity:    Worry: Not on file    Inability: Not on file  . Transportation needs:    Medical: Not on file    Non-medical: Not on file  Tobacco Use  . Smoking status: Never Smoker  . Smokeless tobacco: Never Used  Substance and Sexual Activity  . Alcohol use: No    Alcohol/week: 0.0 standard drinks  . Drug use: No  . Sexual activity: Yes    Partners: Male    Birth control/protection: IUD  Lifestyle  . Physical activity:    Days per week: Not on file    Minutes per session: Not on file  . Stress: Not on file  Relationships  . Social connections:    Talks on phone: Not on file    Gets together: Not on file    Attends religious service:  Not on file    Active member of club or organization: Not on file    Attends meetings of clubs or organizations: Not on file    Relationship status: Not on file  . Intimate partner violence:    Fear of current or ex partner: Not on file    Emotionally abused: Not on file    Physically abused: Not on file    Forced sexual activity: Not on file  Other Topics Concern  . Not on file  Social History Narrative   Married since 1995   Lives in house, two stories, 6 persons live in home, no pets   No exercise     Family History  Problem Relation Age of Onset  . Cancer Mother        lung  . Heart attack Father   . Diabetes Father   . Hyperlipidemia Father     The following portions of the patient's history were reviewed and updated as appropriate: allergies, current medications, past family history, past medical history, past social history, past surgical  history and problem list.  Review of Systems Pertinent items noted in HPI and remainder of comprehensive ROS otherwise negative.   Objective:  BP (!) 127/91   Pulse 74   Ht 4\' 10"  (1.473 m)   Wt 69.9 kg   LMP 05/28/2018   BMI 32.21 kg/m  CONSTITUTIONAL: Well-developed, well-nourished female in no acute distress.  HENT:  Normocephalic, atraumatic, External right and left ear normal. Oropharynx is clear and moist EYES: Conjunctivae and EOM are normal. Pupils are equal, round, and reactive to light. No scleral icterus.  NECK: Normal range of motion, supple, no masses.  Normal thyroid.  SKIN: Skin is warm and dry. No rash noted. Not diaphoretic. No erythema. No pallor. NEUROLOGIC: Alert and oriented to person, place, and time. Normal reflexes, muscle tone coordination. No cranial nerve deficit noted. PSYCHIATRIC: Normal mood and affect. Normal behavior. Normal judgment and thought content. CARDIOVASCULAR: Normal heart rate noted, regular rhythm RESPIRATORY: Clear to auscultation bilaterally. Effort and breath sounds normal, no problems with respiration noted. BREASTS: Symmetric in size. No masses, skin changes, nipple drainage, or lymphadenopathy. ABDOMEN: Soft, normal bowel sounds, no distention noted.  No tenderness, rebound or guarding.  PELVIC: Normal appearing external genitalia; normal appearing vaginal mucosa and cervix.  No abnormal discharge noted.  Pap smear obtained. IUD string visible  Normal uterine size, no other palpable masses, no uterine or adnexal tenderness. MUSCULOSKELETAL: Normal range of motion. No tenderness.  No cyanosis, clubbing, or edema.  2+ distal pulses.   Assessment:  Annual gynecologic examination with pap smear Abnormal uterine bleeding with Mirena IUD, ?new polyp    Plan:  Will follow up results of pap smear and manage accordingly. Mammogram results reviewed Discussed AUB.  New polyp not likely with Mirena, but will order [place] to verify.  Since it is  spotting and now stopped, will not use Megace.  Would avoid comb OCPs due to hypertension.  Suspect this is instability of endometrium due to Mirena effect Routine preventative health maintenance measures emphasized. Please refer to After Visit Summary for other counseling recommendations.

## 2018-06-27 LAB — CYTOLOGY - PAP
Diagnosis: NEGATIVE
HPV: NOT DETECTED

## 2018-07-03 ENCOUNTER — Encounter: Payer: Self-pay | Admitting: Medical

## 2018-07-04 ENCOUNTER — Telehealth: Payer: Self-pay | Admitting: Medical

## 2018-07-04 DIAGNOSIS — M6740 Ganglion, unspecified site: Secondary | ICD-10-CM

## 2018-07-04 NOTE — Telephone Encounter (Signed)
Referral to ortho/hand surgeon placed.

## 2018-07-10 ENCOUNTER — Telehealth: Payer: Self-pay | Admitting: Medical

## 2018-07-10 ENCOUNTER — Encounter: Payer: Self-pay | Admitting: Medical

## 2018-07-10 DIAGNOSIS — M79646 Pain in unspecified finger(s): Secondary | ICD-10-CM

## 2018-07-10 DIAGNOSIS — M25539 Pain in unspecified wrist: Secondary | ICD-10-CM

## 2018-07-31 ENCOUNTER — Encounter (HOSPITAL_BASED_OUTPATIENT_CLINIC_OR_DEPARTMENT_OTHER): Payer: Self-pay | Admitting: *Deleted

## 2018-07-31 ENCOUNTER — Other Ambulatory Visit: Payer: Self-pay | Admitting: Orthopedic Surgery

## 2018-07-31 ENCOUNTER — Other Ambulatory Visit: Payer: Self-pay

## 2018-08-01 ENCOUNTER — Encounter (HOSPITAL_BASED_OUTPATIENT_CLINIC_OR_DEPARTMENT_OTHER)
Admission: RE | Admit: 2018-08-01 | Discharge: 2018-08-01 | Disposition: A | Discharge status: Home / Self Care | Payer: BLUE CROSS/BLUE SHIELD | Source: Ambulatory Visit | Attending: Orthopedic Surgery | Admitting: Orthopedic Surgery

## 2018-08-01 DIAGNOSIS — Z01810 Encounter for preprocedural cardiovascular examination: Secondary | ICD-10-CM | POA: Diagnosis not present

## 2018-08-01 DIAGNOSIS — I10 Essential (primary) hypertension: Secondary | ICD-10-CM | POA: Diagnosis not present

## 2018-08-01 NOTE — H&P (Signed)
Rebekah Knox is an 47 y.o. female.   CC / Reason for Visit: Right index finger mass; left wrist problem HPI: This patient is a 47 year old, right-hand-dominant, female who indicates that approximately 2 months ago she started noticing a mass growing on her index finger.  It is not significantly painful, nor was there any type of event or mechanism that may have caused the mass.  She states that it is uncomfortable if she strikes it or at times if she presses on it.  She further states that at times her digit feels tight with range of motion.  The patient also has an enlargement of the radial aspect of her right wrist.  She has seen [name] as well as rheumatologist.  We do not have access to any of these notes.  She is here today mainly for her right index finger.  Past Medical History:  Diagnosis Date  . Dysmenorrhea   . Hyperlipidemia   . Thyroid disease     Past Surgical History:  Procedure Laterality Date  . WISDOM TOOTH EXTRACTION      Family History  Problem Relation Age of Onset  . Cancer Mother        lung  . Heart attack Father   . Diabetes Father   . Hyperlipidemia Father    Social History:  reports that she has never smoked. She has never used smokeless tobacco. She reports that she does not drink alcohol or use drugs.  Allergies:  Allergies  Allergen Reactions  . Ibuprofen Swelling    No medications prior to admission.    No results found for this or any previous visit (from the past 48 hour(s)). No results found.  Review of Systems  All other systems reviewed and are negative.   Height 4\' 10"  (1.473 m), weight 69 kg. Physical Exam  Constitutional:  WD, WN, NAD HEENT:  NCAT, EOMI Neuro/Psych:  Alert & oriented to person, place, and time; appropriate mood & affect Lymphatic: No generalized UE edema or lymphadenopathy Extremities / MSK:  Both UE are normal with respect to appearance, ranges of motion, joint stability, muscle strength/tone,  sensation, & perfusion except as otherwise noted:  The right index finger has a 1 cm x 1 cm round firm mass on the radial aspect of the digit just proximal to the PIP.  Light touch sensibility distal to the mass is intact without any altered sensibility per patient subjective report.  Patient has full range of motion.  The mass remains stationary with any motion.  The left wrist is enlarged along the radial portion, with decreased range of motion.  She has full digital motion.  Full supination and pronation.  And NVI.  Labs / Xrays: No radiographic studies obtained today.  X-rays including 3 views the right index finger obtained previous to this visit demonstrate no fractures, dislocations, or bony lesions.  Can clearly see an opaque mass on the radial aspect of the digit.  There were also x-rays including bilateral AP of the wrist and oblique of the wrist.  These demonstrate what appear to be a SLAC wrist with radial beaking on the left.  Assessment:  1.  Right index finger mass 2.  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  They wish to move forward with surgical excision of the right index finger mass on Monday, 08/06/2018.The details of the operative procedure were discussed with the patient.  Questions were invited  and answered.  In addition to the goal of the procedure, the risks of the procedure to include but not limited to bleeding; infection; damage to the nerves or blood vessels that could result in bleeding, numbness, weakness, chronic pain, and the need for additional procedures; stiffness; the need for revision surgery; and anesthetic risks were reviewed.  No specific outcome was guaranteed or implied.  Informed consent was obtained.  Furthermore, they signed the forms for release of information from both the rheumatologist as well as [place] [facility] for the left wrist.  [name], MD 08/01/2018,  6:34 PM

## 2018-08-06 ENCOUNTER — Other Ambulatory Visit: Payer: Self-pay

## 2018-08-06 ENCOUNTER — Encounter (HOSPITAL_BASED_OUTPATIENT_CLINIC_OR_DEPARTMENT_OTHER): Payer: Self-pay

## 2018-08-06 ENCOUNTER — Ambulatory Visit (HOSPITAL_BASED_OUTPATIENT_CLINIC_OR_DEPARTMENT_OTHER): Payer: BLUE CROSS/BLUE SHIELD | Admitting: Certified Registered"

## 2018-08-06 ENCOUNTER — Ambulatory Visit (HOSPITAL_BASED_OUTPATIENT_CLINIC_OR_DEPARTMENT_OTHER)
Admission: RE | Admit: 2018-08-06 | Discharge: 2018-08-06 | Disposition: A | Discharge status: Home / Self Care | Payer: BLUE CROSS/BLUE SHIELD | Attending: Orthopedic Surgery | Admitting: Orthopedic Surgery

## 2018-08-06 ENCOUNTER — Encounter (HOSPITAL_BASED_OUTPATIENT_CLINIC_OR_DEPARTMENT_OTHER)
Admission: RE | Disposition: A | Discharge status: Home / Self Care | Payer: Self-pay | Source: Home / Self Care | Attending: Orthopedic Surgery

## 2018-08-06 DIAGNOSIS — D481 Neoplasm of uncertain behavior of connective and other soft tissue: Secondary | ICD-10-CM | POA: Insufficient documentation

## 2018-08-06 DIAGNOSIS — R2231 Localized swelling, mass and lump, right upper limb: Secondary | ICD-10-CM | POA: Diagnosis present

## 2018-08-06 HISTORY — PX: EXCISION METACARPAL MASS: SHX6372

## 2018-08-06 SURGERY — EXCISION METACARPAL MASS
Anesthesia: Monitor Anesthesia Care | Site: Finger | Laterality: Right

## 2018-08-06 MED ORDER — CEFAZOLIN SODIUM-DEXTROSE 2-4 GM/100ML-% IV SOLN
INTRAVENOUS | Status: AC
  Filled 2018-08-06: qty 100

## 2018-08-06 MED ORDER — MIDAZOLAM HCL 2 MG/2ML IJ SOLN
INTRAMUSCULAR | Status: AC
  Filled 2018-08-06: qty 2

## 2018-08-06 MED ORDER — ONDANSETRON HCL 4 MG/2ML IJ SOLN
INTRAMUSCULAR | Status: AC
  Filled 2018-08-06: qty 14

## 2018-08-06 MED ORDER — PROMETHAZINE HCL 25 MG/ML IJ SOLN: 6.2500 mg | INTRAMUSCULAR | Status: DC | PRN

## 2018-08-06 MED ORDER — DEXAMETHASONE SODIUM PHOSPHATE 10 MG/ML IJ SOLN
INTRAMUSCULAR | Status: AC
  Filled 2018-08-06: qty 3

## 2018-08-06 MED ORDER — LACTATED RINGERS IV SOLN
INTRAVENOUS | Status: DC
  Administered 2018-08-06: 10:00:00 via INTRAVENOUS

## 2018-08-06 MED ORDER — BUPIVACAINE-EPINEPHRINE 0.25% -1:200000 IJ SOLN
INTRAMUSCULAR | Status: DC | PRN
  Administered 2018-08-06: 5 mL

## 2018-08-06 MED ORDER — LIDOCAINE HCL 2 % IJ SOLN
INTRAMUSCULAR | Status: AC
  Filled 2018-08-06: qty 20

## 2018-08-06 MED ORDER — CEFAZOLIN SODIUM-DEXTROSE 2-4 GM/100ML-% IV SOLN
2.0000 g | INTRAVENOUS | Status: AC
  Administered 2018-08-06: 2 g via INTRAVENOUS

## 2018-08-06 MED ORDER — FENTANYL CITRATE (PF) 100 MCG/2ML IJ SOLN: 25.0000 ug | INTRAMUSCULAR | Status: DC | PRN

## 2018-08-06 MED ORDER — FENTANYL CITRATE (PF) 100 MCG/2ML IJ SOLN
50.0000 ug | INTRAMUSCULAR | Status: DC | PRN
  Administered 2018-08-06: 100 ug via INTRAVENOUS

## 2018-08-06 MED ORDER — OXYCODONE HCL 5 MG PO TABS: 5.0000 mg | ORAL_TABLET | Freq: Once | ORAL | Status: DC | PRN

## 2018-08-06 MED ORDER — OXYCODONE HCL 5 MG PO TABS: 5.0000 mg | ORAL_TABLET | Freq: Four times a day (QID) | ORAL | 0 refills | Status: DC | PRN

## 2018-08-06 MED ORDER — LACTATED RINGERS IV SOLN: INTRAVENOUS | Status: DC

## 2018-08-06 MED ORDER — SCOPOLAMINE 1 MG/3DAYS TD PT72: 1.0000 | MEDICATED_PATCH | Freq: Once | TRANSDERMAL | Status: DC | PRN

## 2018-08-06 MED ORDER — ONDANSETRON HCL 4 MG/2ML IJ SOLN
INTRAMUSCULAR | Status: DC | PRN
  Administered 2018-08-06: 4 mg via INTRAVENOUS

## 2018-08-06 MED ORDER — ACETAMINOPHEN 325 MG PO TABS: 650.0000 mg | ORAL_TABLET | Freq: Four times a day (QID) | ORAL | Status: DC

## 2018-08-06 MED ORDER — MEPERIDINE HCL 25 MG/ML IJ SOLN: 6.2500 mg | INTRAMUSCULAR | Status: DC | PRN

## 2018-08-06 MED ORDER — MIDAZOLAM HCL 2 MG/2ML IJ SOLN
1.0000 mg | INTRAMUSCULAR | Status: DC | PRN
  Administered 2018-08-06: 2 mg via INTRAVENOUS

## 2018-08-06 MED ORDER — FENTANYL CITRATE (PF) 100 MCG/2ML IJ SOLN
INTRAMUSCULAR | Status: AC
  Filled 2018-08-06: qty 2

## 2018-08-06 MED ORDER — LIDOCAINE 2% (20 MG/ML) 5 ML SYRINGE
INTRAMUSCULAR | Status: AC
  Filled 2018-08-06: qty 30

## 2018-08-06 MED ORDER — LIDOCAINE HCL (PF) 2 % IJ SOLN
INTRAMUSCULAR | Status: DC | PRN
  Administered 2018-08-06: 5 mL

## 2018-08-06 MED ORDER — PROPOFOL 500 MG/50ML IV EMUL
INTRAVENOUS | Status: DC | PRN
  Administered 2018-08-06: 75 ug/kg/min via INTRAVENOUS

## 2018-08-06 MED ORDER — IBUPROFEN 200 MG PO TABS: 600.0000 mg | ORAL_TABLET | Freq: Four times a day (QID) | ORAL | 0 refills | Status: DC

## 2018-08-06 MED ORDER — OXYCODONE HCL 5 MG/5ML PO SOLN: 5.0000 mg | Freq: Once | ORAL | Status: DC | PRN

## 2018-08-06 SURGICAL SUPPLY — 47 items
BANDAGE COBAN STERILE 2 (GAUZE/BANDAGES/DRESSINGS) IMPLANT
BLADE MINI RND TIP GREEN BEAV (BLADE) IMPLANT
BLADE SURG 15 STRL LF DISP TIS (BLADE) ×1 IMPLANT
BLADE SURG 15 STRL SS (BLADE) ×1
BNDG COHESIVE 1X5 TAN STRL LF (GAUZE/BANDAGES/DRESSINGS) ×2 IMPLANT
BNDG COHESIVE 4X5 TAN STRL (GAUZE/BANDAGES/DRESSINGS) IMPLANT
BNDG CONFORM 2 STRL LF (GAUZE/BANDAGES/DRESSINGS) ×2 IMPLANT
BNDG ESMARK 4X9 LF (GAUZE/BANDAGES/DRESSINGS) ×2 IMPLANT
BNDG GAUZE 1X2.1 STRL (MISCELLANEOUS) IMPLANT
BNDG GAUZE ELAST 4 BULKY (GAUZE/BANDAGES/DRESSINGS) IMPLANT
CHLORAPREP W/TINT 26ML (MISCELLANEOUS) ×2 IMPLANT
CORD BIPOLAR FORCEPS 12FT (ELECTRODE) IMPLANT
COVER BACK TABLE 60X90IN (DRAPES) ×2 IMPLANT
COVER MAYO STAND STRL (DRAPES) ×2 IMPLANT
COVER WAND RF STERILE (DRAPES) IMPLANT
CUFF TOURNIQUET SINGLE 18IN (TOURNIQUET CUFF) ×2 IMPLANT
DRAIN PENROSE 1/2X12 LTX STRL (WOUND CARE) IMPLANT
DRAPE EXTREMITY T 121X128X90 (DRAPE) ×2 IMPLANT
DRAPE SURG 17X23 STRL (DRAPES) ×2 IMPLANT
DRSG EMULSION OIL 3X3 NADH (GAUZE/BANDAGES/DRESSINGS) ×2 IMPLANT
GAUZE SPONGE 4X4 12PLY STRL LF (GAUZE/BANDAGES/DRESSINGS) ×2 IMPLANT
GLOVE BIO SURGEON STRL SZ7.5 (GLOVE) ×2 IMPLANT
GLOVE BIOGEL PI IND STRL 7.0 (GLOVE) ×2 IMPLANT
GLOVE BIOGEL PI IND STRL 8 (GLOVE) ×1 IMPLANT
GLOVE BIOGEL PI INDICATOR 7.0 (GLOVE) ×2
GLOVE BIOGEL PI INDICATOR 8 (GLOVE) ×1
GLOVE ECLIPSE 6.5 STRL STRAW (GLOVE) ×4 IMPLANT
GOWN STRL REUS W/ TWL LRG LVL3 (GOWN DISPOSABLE) ×2 IMPLANT
GOWN STRL REUS W/TWL LRG LVL3 (GOWN DISPOSABLE) ×2
GOWN STRL REUS W/TWL XL LVL3 (GOWN DISPOSABLE) ×2 IMPLANT
NDL SAFETY ECLIPSE 18X1.5 (NEEDLE) ×1 IMPLANT
NEEDLE HYPO 18GX1.5 SHARP (NEEDLE) ×1
NEEDLE HYPO 25X1 1.5 SAFETY (NEEDLE) ×2 IMPLANT
NS IRRIG 1000ML POUR BTL (IV SOLUTION) ×2 IMPLANT
PACK BASIN DAY SURGERY FS (CUSTOM PROCEDURE TRAY) ×2 IMPLANT
PADDING CAST ABS 4INX4YD NS (CAST SUPPLIES)
PADDING CAST ABS COTTON 4X4 ST (CAST SUPPLIES) IMPLANT
RUBBERBAND STERILE (MISCELLANEOUS) IMPLANT
STOCKINETTE 6  STRL (DRAPES) ×1
STOCKINETTE 6 STRL (DRAPES) ×1 IMPLANT
SUT VICRYL RAPIDE 4-0 (SUTURE) IMPLANT
SUT VICRYL RAPIDE 4/0 PS 2 (SUTURE) ×2 IMPLANT
SYR 10ML LL (SYRINGE) ×2 IMPLANT
SYR BULB 3OZ (MISCELLANEOUS) ×2 IMPLANT
TOWEL GREEN STERILE FF (TOWEL DISPOSABLE) ×2 IMPLANT
TOWEL OR NON WOVEN STRL DISP B (DISPOSABLE) IMPLANT
UNDERPAD 30X30 (UNDERPADS AND DIAPERS) IMPLANT

## 2018-08-06 NOTE — Discharge Instructions (Signed)
°  Post Anesthesia Home Care Instructions  Activity: Get plenty of rest for the remainder of the day. A responsible individual must stay with you for 24 hours following the procedure.  For the next 24 hours, DO NOT: -Drive a car -Paediatric nurse -Drink alcoholic beverages -Take any medication unless instructed by your physician -Make any legal decisions or sign important papers.  Meals: Start with liquid foods such as gelatin or soup. Progress to regular foods as tolerated. Avoid greasy, spicy, heavy foods. If nausea and/or vomiting occur, drink only clear liquids until the nausea and/or vomiting subsides. Call your physician if vomiting continues.  Special Instructions/Symptoms: Your throat may feel dry or sore from the anesthesia or the breathing tube placed in your throat during surgery. If this causes discomfort, gargle with warm salt water. The discomfort should disappear within 24 hours.  If you had a scopolamine patch placed behind your ear for the management of post- operative nausea and/or vomiting:  1. The medication in the patch is effective for 72 hours, after which it should be removed.  Wrap patch in a tissue and discard in the trash. Wash hands thoroughly with soap and water. 2. You may remove the patch earlier than 72 hours if you experience unpleasant side effects which may include dry mouth, dizziness or visual disturbances. 3. Avoid touching the patch. Wash your hands with soap and water after contact with the patch.   Discharge Instructions   You have a light dressing on your hand.  You may begin gentle motion of your fingers and hand immediately into a full fist and extend the fingers fully, but you should not do any heavy lifting or gripping.  Elevate your hand to reduce pain & swelling of the digits.  Ice over the operative site may be helpful to reduce pain & swelling.  DO NOT USE HEAT. Pain medicine has been prescribed for you.  Take Tylenol 650 mg and Ibuprofen  600 mg every 6 hours together. They are over the counter. The Oxycodone is for severe post operative pain as a rescue medicine. Leave the dressing in place until the third day after your surgery and then remove it, leaving it open to air.  After the bandage has been removed you may shower, regularly washing the incision and letting the water run over it, but not submerging it (no swimming, soaking it in dishwater, etc.) You may drive a car when you are off of prescription pain medications and can safely control your vehicle with both hands. We will address whether therapy will be required or not when you return to the office. You may have already made your follow-up appointment when we completed your preop visit.  If not, please call our office today or the next business day to make your return appointment for 10-15 days after surgery.   Please call [phone] during normal business hours or [phone] after hours for any problems. Including the following:  - excessive redness of the incisions - drainage for more than 4 days - fever of more than 101.5 F  *Please note that pain medications will not be refilled after hours or on weekends.  Work Note: No lifting gripping or grasping greater than pencil and paper tasks until first post operative appointment.

## 2018-08-06 NOTE — Anesthesia Procedure Notes (Signed)
Procedure Name: MAC Date/Time: 08/06/2018 10:57 AM Performed by: Signe Colt, CRNA Pre-anesthesia Checklist: Patient identified, Emergency Drugs available, Suction available, Patient being monitored and Timeout performed Patient Re-evaluated:Patient Re-evaluated prior to induction Oxygen Delivery Method: Simple face mask

## 2018-08-06 NOTE — Interval H&P Note (Signed)
History and Physical Interval Note:  08/06/2018 10:35 AM  Rebekah Knox  has presented today for surgery, with the diagnosis of RIGHT INDEX FINGER MASS  The various methods of treatment have been discussed with the patient and family. After consideration of risks, benefits and other options for treatment, the patient has consented to  Procedure(s) with comments: EXCISION OF INDEX FINGER MASS (Right) - LENGTH: 30 MINS as a surgical intervention .  The patient's history has been reviewed, patient examined, no change in status, stable for surgery.  I have reviewed the patient's chart and labs.  Questions were answered to the patient's satisfaction.     [name]

## 2018-08-06 NOTE — Anesthesia Preprocedure Evaluation (Signed)
Anesthesia Evaluation  Patient identified by MRN, date of birth, ID band Patient awake    Reviewed: Allergy & Precautions, NPO status , Patient's Chart, lab work & pertinent test results  Airway Mallampati: II  TM Distance: >3 FB Neck ROM: Full    Dental  (+) Dental Advisory Given   Pulmonary neg pulmonary ROS,    Pulmonary exam normal breath sounds clear to auscultation       Cardiovascular negative cardio ROS Normal cardiovascular exam Rhythm:Regular Rate:Normal     Neuro/Psych negative neurological ROS  negative psych ROS   GI/Hepatic negative GI ROS, Neg liver ROS,   Endo/Other  Hypothyroidism   Renal/GU negative Renal ROS     Musculoskeletal negative musculoskeletal ROS (+)   Abdominal (+) + obese,   Peds  Hematology negative hematology ROS (+)   Anesthesia Other Findings   Reproductive/Obstetrics negative OB ROS                             Anesthesia Physical Anesthesia Plan  ASA: II  Anesthesia Plan: MAC   Post-op Pain Management:    Induction: Intravenous  PONV Risk Score and Plan: 2 and Ondansetron, Propofol infusion and Midazolam  Airway Management Planned: Simple Face Mask  Additional Equipment: None  Intra-op Plan:   Post-operative Plan:   Informed Consent: I have reviewed the patients History and Physical, chart, labs and discussed the procedure including the risks, benefits and alternatives for the proposed anesthesia with the patient or authorized representative who has indicated his/her understanding and acceptance.   Dental advisory given  Plan Discussed with: CRNA  Anesthesia Plan Comments:         Anesthesia Quick Evaluation

## 2018-08-06 NOTE — Op Note (Signed)
08/06/2018  10:35 AM  PATIENT:  Rebekah Knox  47 y.o. female  PRE-OPERATIVE DIAGNOSIS:  R IF mass  POST-OPERATIVE DIAGNOSIS:  Same  PROCEDURE:  marginal excision of R IF mass(2 cm)  SURGEON: Rayvon Char. Grandville Silos, MD  PHYSICIAN ASSISTANT: Morley Kos  ANESTHESIA:  local and MAC  SPECIMENS:  None  DRAINS:   None  EBL:  less than 50 mL  PREOPERATIVE INDICATIONS:  Rebekah Knox is a  47 y.o. female with R IF slowly growing mass, non-calcified on xrays  The risks benefits and alternatives were discussed with the patient preoperatively including but not limited to the risks of infection, bleeding, nerve injury, cardiopulmonary complications, the need for revision surgery, among others, and the patient verbalized understanding and consented to proceed.  OPERATIVE IMPLANTS: None  OPERATIVE PROCEDURE:  After receiving prophylactic antibiotics, the patient was escorted to the operative theatre and placed in a supine position.  A surgical "time-out" was performed during which the planned procedure, proposed operative site, and the correct patient identity were compared to the operative consent and agreement confirmed by the circulating nurse according to current facility policy.  Following application of a tourniquet to the operative extremity, the exposed skin was prepped with Chloraprep and draped in the usual sterile fashion.  The limb from the hand proximal was exsanguinated with an Esmarch bandage and the tourniquet inflated to approximately 172mHg higher than systolic BP.  A linear incision was made over the mid axial line of the radial aspect of the index finger.  Subcutaneous tissues were dissected blunt spreading dissection.  A firm dark rust/greenish colored mass that was well encapsulated was identified.  It was marginally excised, taking all that the periosteum under it and dividing the soft tissues adjacent to the pseudocapsule mass.  There did not appear to be any gross  abnormal tissue remaining.  The radial neurovascular bundle was protected in the course of the dissection.  The wound was irrigated, tourniquet released, and the skin closed with 4-0 Vicryl Rapide running horizontal mattress suture.  A digital dressing was applied and she was taken to the recovery room in stable condition.  DISPOSITION: She will be discharged home today with typical instructions returning in 10 to 15 days.

## 2018-08-06 NOTE — Transfer of Care (Signed)
Immediate Anesthesia Transfer of Care Note  Patient: Marvel Sapp  Procedure(s) Performed: EXCISION OF INDEX FINGER MASS (Right Finger)  Patient Location: PACU  Anesthesia Type:MAC  Level of Consciousness: awake, alert , oriented and patient cooperative  Airway & Oxygen Therapy: Patient Spontanous Breathing and Patient connected to face mask oxygen  Post-op Assessment: Report given to RN and Post -op Vital signs reviewed and stable  Post vital signs: Reviewed and stable  Last Vitals:  Vitals Value Taken Time  BP    Temp    Pulse 74 08/06/2018 11:20 AM  Resp 15 08/06/2018 11:20 AM  SpO2 100 % 08/06/2018 11:20 AM  Vitals shown include unvalidated device data.  Last Pain:  Vitals:   08/06/18 0947  TempSrc: Oral  PainSc: 0-No pain         Complications: No apparent anesthesia complications

## 2018-08-07 ENCOUNTER — Encounter: Payer: Self-pay | Admitting: Medical

## 2018-08-07 ENCOUNTER — Encounter (HOSPITAL_BASED_OUTPATIENT_CLINIC_OR_DEPARTMENT_OTHER): Payer: Self-pay | Admitting: Orthopedic Surgery

## 2018-08-07 NOTE — Anesthesia Postprocedure Evaluation (Signed)
Anesthesia Post Note  Patient: Rebekah Knox  Procedure(s) Performed: EXCISION OF INDEX FINGER MASS (Right Finger)     Patient location during evaluation: PACU Anesthesia Type: MAC Level of consciousness: awake and alert Pain management: pain level controlled Vital Signs Assessment: post-procedure vital signs reviewed and stable Respiratory status: spontaneous breathing Cardiovascular status: stable Anesthetic complications: no    Last Vitals:  Vitals:   08/06/18 1145 08/06/18 1158  BP: 140/89 123/81  Pulse: 72 66  Resp: 13 16  Temp:  36.8 C  SpO2: 97% 100%    Last Pain:  Vitals:   08/07/18 1043  TempSrc:   PainSc: 0-No pain                 Nolon Nations

## 2018-09-28 ENCOUNTER — Other Ambulatory Visit: Payer: Self-pay | Admitting: Medical

## 2018-12-02 ENCOUNTER — Other Ambulatory Visit: Payer: Self-pay | Admitting: Medical

## 2018-12-02 ENCOUNTER — Encounter: Payer: Self-pay | Admitting: Medical

## 2018-12-03 MED ORDER — LEVOTHYROXINE SODIUM 100 MCG PO TABS: 100.0000 ug | ORAL_TABLET | Freq: Every day | ORAL | 1 refills | Status: DC

## 2018-12-03 MED ORDER — AMLODIPINE BESYLATE 5 MG PO TABS: 5.0000 mg | ORAL_TABLET | Freq: Every day | ORAL | 1 refills | Status: DC

## 2018-12-03 MED ORDER — TELMISARTAN 40 MG PO TABS: 40.0000 mg | ORAL_TABLET | Freq: Every day | ORAL | 1 refills | Status: DC

## 2019-02-25 ENCOUNTER — Encounter: Payer: Self-pay | Admitting: Medical

## 2019-02-26 ENCOUNTER — Other Ambulatory Visit: Payer: Self-pay | Admitting: Medical

## 2019-02-27 ENCOUNTER — Encounter: Payer: Self-pay | Admitting: Medical

## 2019-02-27 ENCOUNTER — Other Ambulatory Visit: Payer: Self-pay

## 2019-02-27 ENCOUNTER — Ambulatory Visit (INDEPENDENT_AMBULATORY_CARE_PROVIDER_SITE_OTHER): Payer: BLUE CROSS/BLUE SHIELD | Admitting: Medical

## 2019-02-27 VITALS — BP 130/83

## 2019-02-27 DIAGNOSIS — E039 Hypothyroidism, unspecified: Secondary | ICD-10-CM | POA: Diagnosis not present

## 2019-02-27 DIAGNOSIS — I10 Essential (primary) hypertension: Secondary | ICD-10-CM

## 2019-02-27 DIAGNOSIS — E785 Hyperlipidemia, unspecified: Secondary | ICD-10-CM

## 2019-02-27 MED ORDER — TELMISARTAN 40 MG PO TABS: 40.0000 mg | ORAL_TABLET | Freq: Every day | ORAL | 1 refills | Status: DC

## 2019-02-27 MED ORDER — LEVOTHYROXINE SODIUM 100 MCG PO TABS: 100.0000 ug | ORAL_TABLET | Freq: Every day | ORAL | 3 refills | Status: DC

## 2019-02-27 MED ORDER — SIMVASTATIN 20 MG PO TABS: 20.0000 mg | ORAL_TABLET | Freq: Every day | ORAL | 3 refills | Status: DC

## 2019-02-27 MED ORDER — AMLODIPINE BESYLATE 5 MG PO TABS: 5.0000 mg | ORAL_TABLET | Freq: Every day | ORAL | 1 refills | Status: DC

## 2019-02-27 NOTE — Progress Notes (Signed)
Subjective:    Patient ID: Rebekah Knox, female    DOB: 1971/01/14, 48 y.o.   MRN: [id number]  HPI  Virtual Visit via Video Note  I connected with Rebekah Knox on 02/27/19 at  1:00 PM EDT by a video enabled telemedicine application and verified that I am speaking with the correct person using two identifiers.  Location: Patient: work Provider: home   bp was 130/83. Pulse 85   I discussed the limitations of evaluation and management by telemedicine and the availability of in person appointments. The patient expressed understanding and agreed to proceed.  History of Present Illness:  Pt ran out of thyroid med 4 days ago.   Pt has htn. Her bp checked one day a week and normal.  No cardiac or neurologic signs or symptoms.   Observations/Objective: General-no acute distress, pleasant, oriented. Lungs- on inspection lungs appear unlabored. Neck- no tracheal deviation or jvd on inspection. Neuro- gross motor function appears intact.  Assessment and Plan: Patient's blood pressure has been well controlled.  Will refill amlodipine and telmisartan today.  Rxs sent to patient's pharmacy.  High cholesterol, refilled her Zocor but also place future labs lipid panel and CMP to be done in 3 months fasting.  History of low thyroid and off medicines for 4 days, I sent in refill of her levothyroxine and place future order TSH to be done as well in 3 months.  Follow-up date to be determined after review of future lab results.  Or as needed  [employer], PA-C.  Follow Up Instructions:    I discussed the assessment and treatment plan with the patient. The patient was provided an opportunity to ask questions and all were answered. The patient agreed with the plan and demonstrated an understanding of the instructions.   The patient was advised to call back or seek an in-person evaluation if the symptoms worsen or if the condition fails to improve as anticipated.  I provided 15  minutes of non-face-to-face time during this encounter.   Mackie Pai, PA-C    Review of Systems  Constitutional: Negative for chills and fatigue.  HENT: Negative for congestion.   Respiratory: Negative for cough, chest tightness, shortness of breath and wheezing.   Cardiovascular: Negative for chest pain and palpitations.  Gastrointestinal: Negative for abdominal pain.  Endocrine: Negative for polydipsia and polyphagia.  Genitourinary: Negative for dysuria.  Musculoskeletal: Negative for back pain and myalgias.  Neurological: Negative for dizziness and headaches.  Hematological: Negative for adenopathy. Does not bruise/bleed easily.  Psychiatric/Behavioral: Negative for behavioral problems and confusion.    Past Medical History:  Diagnosis Date  . Dysmenorrhea   . Hyperlipidemia   . Thyroid disease      Social History   Socioeconomic History  . Marital status: Married    Spouse name: Not on file  . Number of children: Not on file  . Years of education: Not on file  . Highest education level: Not on file  Occupational History  . Not on file  Social Needs  . Financial resource strain: Not on file  . Food insecurity    Worry: Not on file    Inability: Not on file  . Transportation needs    Medical: Not on file    Non-medical: Not on file  Tobacco Use  . Smoking status: Never Smoker  . Smokeless tobacco: Never Used  Substance and Sexual Activity  . Alcohol use: No    Alcohol/week: 0.0 standard drinks  . Drug use:  No  . Sexual activity: Yes    Partners: Male    Birth control/protection: I.U.D.  Lifestyle  . Physical activity    Days per week: Not on file    Minutes per session: Not on file  . Stress: Not on file  Relationships  . Social Herbalist on phone: Not on file    Gets together: Not on file    Attends religious service: Not on file    Active member of club or organization: Not on file    Attends meetings of clubs or organizations:  Not on file    Relationship status: Not on file  . Intimate partner violence    Fear of current or ex partner: Not on file    Emotionally abused: Not on file    Physically abused: Not on file    Forced sexual activity: Not on file  Other Topics Concern  . Not on file  Social History Narrative   Married since 1995   Lives in house, two stories, 6 persons live in home, no pets   No exercise     Past Surgical History:  Procedure Laterality Date  . EXCISION METACARPAL MASS Right 08/06/2018   Procedure: EXCISION OF INDEX FINGER MASS;  Surgeon: Milly Jakob, MD;  Location: [place];  Service: Orthopedics;  Laterality: Right;  LENGTH: 30 MINS  . WISDOM TOOTH EXTRACTION      Family History  Problem Relation Age of Onset  . Cancer Mother        lung  . Heart attack Father   . Diabetes Father   . Hyperlipidemia Father     No Active Allergies  Current Outpatient Medications on File Prior to Visit  Medication Sig Dispense Refill  . acetaminophen (TYLENOL) 325 MG tablet Take 2 tablets (650 mg total) by mouth every 6 (six) hours.    Marland Kitchen amLODipine (NORVASC) 5 MG tablet Take 1 tablet (5 mg total) by mouth daily. 30 tablet 1  . ibuprofen (ADVIL) 200 MG tablet Take 3 tablets (600 mg total) by mouth every 6 (six) hours.  0  . oxyCODONE (ROXICODONE) 5 MG immediate release tablet Take 1 tablet (5 mg total) by mouth every 6 (six) hours as needed for breakthrough pain. 15 tablet 0   No current facility-administered medications on file prior to visit.     BP 130/83       Objective:   Physical Exam        Assessment & Plan:

## 2019-02-27 NOTE — Patient Instructions (Signed)
Patient's blood pressure has been well controlled.  Will refill amlodipine and telmisartan today.  Rxs sent to patient's pharmacy.  High cholesterol, refilled her Zocor but also place future labs lipid panel and CMP to be done in 3 months fasting.  History of low thyroid and off medicines for 4 days, I sent in refill of her levothyroxine and place future order TSH to be done as well in 3 months.  Follow-up date to be determined after review of future lab results.  Or as needed.

## 2019-04-01 DIAGNOSIS — I10 Essential (primary) hypertension: Secondary | ICD-10-CM | POA: Insufficient documentation

## 2019-04-01 DIAGNOSIS — R5382 Chronic fatigue, unspecified: Secondary | ICD-10-CM | POA: Insufficient documentation

## 2019-04-12 ENCOUNTER — Encounter: Payer: Self-pay | Admitting: Medical

## 2019-04-12 ENCOUNTER — Encounter: Payer: Self-pay | Admitting: Advanced Practice Midwife

## 2019-04-15 ENCOUNTER — Telehealth: Payer: Self-pay

## 2019-04-15 DIAGNOSIS — Z1239 Encounter for other screening for malignant neoplasm of breast: Secondary | ICD-10-CM

## 2019-04-15 NOTE — Telephone Encounter (Signed)
Copied from [place] [id number]. Topic: General - Inquiry >> Apr 12, 2019 10:26 AM Percell Belt A wrote: Reason for CRM: pt called in and would like [name] to put a order in for her routine Mammogram, she has it done the [facility] building

## 2019-04-16 NOTE — Addendum Note (Signed)
Addended by: Anabel Halon on: 04/16/2019 02:44 PM   Modules accepted: Orders

## 2019-04-16 NOTE — Telephone Encounter (Signed)
Screening breast cancer order placed for [facility] location. Will you notify pt. She can call and get scheduled.

## 2019-04-17 ENCOUNTER — Encounter: Payer: Self-pay | Admitting: Medical

## 2019-04-17 NOTE — Telephone Encounter (Signed)
Patient notified that order has been placed and she can call.  She wanted to be transferred.  Call transferred.

## 2019-04-18 ENCOUNTER — Ambulatory Visit (HOSPITAL_BASED_OUTPATIENT_CLINIC_OR_DEPARTMENT_OTHER)
Admission: RE | Admit: 2019-04-18 | Discharge: 2019-04-18 | Disposition: A | Discharge status: Home / Self Care | Payer: BLUE CROSS/BLUE SHIELD | Source: Ambulatory Visit | Attending: Medical | Admitting: Medical

## 2019-04-18 ENCOUNTER — Other Ambulatory Visit: Payer: Self-pay

## 2019-04-18 DIAGNOSIS — Z1231 Encounter for screening mammogram for malignant neoplasm of breast: Secondary | ICD-10-CM | POA: Diagnosis present

## 2019-04-18 DIAGNOSIS — Z1239 Encounter for other screening for malignant neoplasm of breast: Secondary | ICD-10-CM

## 2019-05-01 ENCOUNTER — Encounter: Payer: Self-pay | Admitting: Medical

## 2019-05-01 ENCOUNTER — Telehealth: Payer: Self-pay | Admitting: Medical

## 2019-05-01 ENCOUNTER — Other Ambulatory Visit: Payer: Self-pay

## 2019-05-01 NOTE — Telephone Encounter (Signed)
LVM for pt to call the office and schedule a virtual office visit tomorrow with provider since pt is needing appt per provider.(tomorrow at 9:40 if possible VOV)

## 2019-05-02 ENCOUNTER — Ambulatory Visit (INDEPENDENT_AMBULATORY_CARE_PROVIDER_SITE_OTHER): Payer: BLUE CROSS/BLUE SHIELD | Admitting: Medical

## 2019-05-02 ENCOUNTER — Encounter: Payer: Self-pay | Admitting: Medical

## 2019-05-02 VITALS — BP 120/82 | HR 74 | Temp 98.0°F

## 2019-05-02 DIAGNOSIS — J40 Bronchitis, not specified as acute or chronic: Secondary | ICD-10-CM

## 2019-05-02 DIAGNOSIS — U071 COVID-19: Secondary | ICD-10-CM | POA: Diagnosis not present

## 2019-05-02 MED ORDER — AZITHROMYCIN 250 MG PO TABS: ORAL_TABLET | ORAL | 0 refills | Status: DC

## 2019-05-02 MED ORDER — BENZONATATE 100 MG PO CAPS: 100.0000 mg | ORAL_CAPSULE | Freq: Three times a day (TID) | ORAL | 0 refills | Status: DC | PRN

## 2019-05-02 NOTE — Progress Notes (Signed)
   Subjective:    Patient ID: Rebekah Knox, female    DOB: [birth year], 48 y.o.   MRN: [id number]  HPI  Virtual Visit via Video Note  I connected with Rebekah Knox on 05/02/19 at 10:00 AM EDT by a video enabled telemedicine application and verified that I am speaking with the correct person using two identifiers.  Location: Patient: home Provider:office   I discussed the limitations of evaluation and management by telemedicine and the availability of in person appointments. The patient expressed understanding and agreed to proceed.  History of Present Illness: Pt has been sick since last Friday sept 4, 2020. Her husband tested + for covid. She has nasal congestion, intermittent productive cough and fever. Pt has been using mucinex dm. Mild ha and dizziness. Pt is feeling progressively better. She is tired. One loose stool yesterday.  Last time had fever was Monday. None since. No wheezing or sob.     Observations/Objective: General-no acute distress, pleasant, oriented.  Sounds little nasal congested. Lungs- on inspection lungs appear unlabored. Neck- no tracheal deviation or jvd on inspection. Neuro- gross motor function appears intact.  Assessment and Plan: Recent COVID infection that was positive through local urgent care.  Patient progressively improving but describing some symptoms of bronchitis.  No fever, wheezing or shortness of breath.  Based on current signs/symptoms, I do think it is best to go ahead and prescribe a azithromycin antibiotic and make benzonatate available for cough.  You have O2 sat monitor available at home and have good baseline O2 sat presently of 97%.  I keep checking your O2 sats daily and if you get significant decrease particularly anything less than 90% then recommend be seen in the emergency department.  If you maintain good normal O2 sat level but develop any wheeze let me know and in that event would give you an inhaler.  Future order  COVID test placed to be repeated next Friday.  If you are clinically doing well I do not think this is necessary but placed due to your work requirements.  Follow-up 7 to 10 days or as needed.  Follow Up Instructions:    I discussed the assessment and treatment plan with the patient. The patient was provided an opportunity to ask questions and all were answered. The patient agreed with the plan and demonstrated an understanding of the instructions.   The patient was advised to call back or seek an in-person evaluation if the symptoms worsen or if the condition fails to improve as anticipated.  I provided 25  minutes of non-face-to-face time during this encounter.   Mackie Pai, PA-C   Review of Systems  Constitutional: Negative for chills and fever.       Early on fever but none recently.  HENT: Positive for congestion. Negative for ear pain, sinus pressure, sinus pain and sore throat.   Respiratory: Positive for cough. Negative for shortness of breath and wheezing.   Cardiovascular: Negative for chest pain and palpitations.  Gastrointestinal: Negative for abdominal pain, blood in stool and constipation.  Musculoskeletal: Negative for back pain, joint swelling and neck stiffness.  Skin: Negative for rash.  Neurological: Negative for syncope, facial asymmetry, weakness and numbness.  Hematological: Negative for adenopathy. Does not bruise/bleed easily.  Psychiatric/Behavioral: Negative for behavioral problems, decreased concentration, hallucinations and suicidal ideas. The patient is not nervous/anxious.        Objective:   Physical Exam        Assessment & Plan:

## 2019-05-02 NOTE — Patient Instructions (Signed)
Recent COVID infection that was positive through local urgent care.  Patient progressively improving but describing some symptoms of bronchitis.  No fever, wheezing or shortness of breath.  Based on current signs/symptoms, I do think it is best to go ahead and prescribe a azithromycin antibiotic and make benzonatate available for cough.  You have O2 sat monitor available at home and have good baseline O2 sat presently of 97%.  I keep checking your O2 sats daily and if you get significant decrease particularly anything less than 90% then recommend be seen in the emergency department.  If you maintain good normal O2 sat level but develop any wheeze let me know and in that event would give you an inhaler.  Future order COVID test placed to be repeated next Friday.  If you are clinically doing well I do not think this is necessary but placed due to your work requirements.  Follow-up 7 to 10 days or as needed.

## 2019-05-10 ENCOUNTER — Other Ambulatory Visit: Payer: Self-pay

## 2019-05-10 DIAGNOSIS — Z20822 Contact with and (suspected) exposure to covid-19: Secondary | ICD-10-CM

## 2019-05-11 LAB — NOVEL CORONAVIRUS, NAA: SARS-CoV-2, NAA: NOT DETECTED

## 2019-05-15 ENCOUNTER — Encounter: Payer: Self-pay | Admitting: Medical

## 2019-05-15 MED ORDER — AMLODIPINE BESYLATE 5 MG PO TABS: 5.0000 mg | ORAL_TABLET | Freq: Every day | ORAL | 1 refills | Status: DC

## 2019-06-24 ENCOUNTER — Other Ambulatory Visit: Payer: Self-pay

## 2019-06-24 ENCOUNTER — Ambulatory Visit (INDEPENDENT_AMBULATORY_CARE_PROVIDER_SITE_OTHER): Payer: BLUE CROSS/BLUE SHIELD | Admitting: Medical

## 2019-06-24 ENCOUNTER — Encounter: Payer: Self-pay | Admitting: Medical

## 2019-06-24 VITALS — BP 126/81 | HR 99 | Temp 97.5°F | Wt 153.0 lb

## 2019-06-24 DIAGNOSIS — E039 Hypothyroidism, unspecified: Secondary | ICD-10-CM

## 2019-06-24 DIAGNOSIS — Z0000 Encounter for general adult medical examination without abnormal findings: Secondary | ICD-10-CM | POA: Diagnosis not present

## 2019-06-24 DIAGNOSIS — Z23 Encounter for immunization: Secondary | ICD-10-CM

## 2019-06-24 LAB — CBC WITH DIFFERENTIAL/PLATELET
Basophils Absolute: 0 10*3/uL (ref 0.0–0.1)
Basophils Relative: 0.4 % (ref 0.0–3.0)
Eosinophils Absolute: 0.1 10*3/uL (ref 0.0–0.7)
Eosinophils Relative: 1.1 % (ref 0.0–5.0)
HCT: 41.3 % (ref 36.0–46.0)
Hemoglobin: 13.9 g/dL (ref 12.0–15.0)
Lymphocytes Relative: 38.1 % (ref 12.0–46.0)
Lymphs Abs: 1.9 10*3/uL (ref 0.7–4.0)
MCHC: 33.6 g/dL (ref 30.0–36.0)
MCV: 85.4 fl (ref 78.0–100.0)
Monocytes Absolute: 0.4 10*3/uL (ref 0.1–1.0)
Monocytes Relative: 8.9 % (ref 3.0–12.0)
Neutro Abs: 2.6 10*3/uL (ref 1.4–7.7)
Neutrophils Relative %: 51.5 % (ref 43.0–77.0)
Platelets: 251 10*3/uL (ref 150.0–400.0)
RBC: 4.84 Mil/uL (ref 3.87–5.11)
RDW: 12.9 % (ref 11.5–15.5)
WBC: 5 10*3/uL (ref 4.0–10.5)

## 2019-06-24 LAB — LIPID PANEL
Cholesterol: 185 mg/dL (ref 0–200)
HDL: 47.8 mg/dL (ref >39.00)
LDL Cholesterol: 104 mg/dL — ABNORMAL HIGH (ref 0–99)
NonHDL: 137.1
Total CHOL/HDL Ratio: 4
Triglycerides: 164 mg/dL — ABNORMAL HIGH (ref 0.0–149.0)
VLDL: 32.8 mg/dL (ref 0.0–40.0)

## 2019-06-24 LAB — COMPREHENSIVE METABOLIC PANEL
ALT: 21 U/L (ref 0–35)
AST: 16 U/L (ref 0–37)
Albumin: 4.1 g/dL (ref 3.5–5.2)
Alkaline Phosphatase: 60 U/L (ref 39–117)
BUN: 8 mg/dL (ref 6–23)
CO2: 29 mEq/L (ref 19–32)
Calcium: 9.3 mg/dL (ref 8.4–10.5)
Chloride: 102 mEq/L (ref 96–112)
Creatinine, Ser: 0.69 mg/dL (ref 0.40–1.20)
GFR: 91.44 mL/min (ref >60.00)
Glucose, Bld: 93 mg/dL (ref 70–99)
Potassium: 4.3 mEq/L (ref 3.5–5.1)
Sodium: 137 mEq/L (ref 135–145)
Total Bilirubin: 0.5 mg/dL (ref 0.2–1.2)
Total Protein: 6.9 g/dL (ref 6.0–8.3)

## 2019-06-24 LAB — TSH: TSH: 0.98 u[IU]/mL (ref 0.35–4.50)

## 2019-06-24 NOTE — Progress Notes (Signed)
Subjective:    Patient ID: Rebekah Knox, female    DOB: 20-Dec-1970, 48 y.o.   MRN: [id number]  HPI  Pt in today for CPE. She is fasting.  Pt will  vaccine today. Pt states regularly walking 2-3 miles a day. Pt state eating healthy.   Pt states last week did not take bp medicine last week.   LMP- 06-23-2019. Expected time.   Has iud(will have removed at 5 years). Placed in [place].  Pt has htn. Now more compliant on taking meds and has high cholesterol.  Last pap was normal last year.  Last mammogram 04/18/2019 was normal.      Review of Systems  Constitutional: Negative for chills, fatigue and fever.  HENT: Negative for congestion, drooling, postnasal drip, sinus pressure and sinus pain.   Respiratory: Negative for cough, chest tightness and wheezing.   Cardiovascular: Negative for chest pain and palpitations.  Gastrointestinal: Negative for abdominal pain and diarrhea.  Musculoskeletal: Negative for back pain, joint swelling and myalgias.  Neurological: Negative for dizziness, speech difficulty, weakness and headaches.  Hematological: Negative for adenopathy. Does not bruise/bleed easily.  Psychiatric/Behavioral: Negative for behavioral problems and confusion. The patient is not nervous/anxious.     Past Medical History:  Diagnosis Date  . Dysmenorrhea   . Hyperlipidemia   . Thyroid disease      Social History   Socioeconomic History  . Marital status: Married    Spouse name: Not on file  . Number of children: Not on file  . Years of education: Not on file  . Highest education level: Not on file  Occupational History  . Not on file  Social Needs  . Financial resource strain: Not on file  . Food insecurity    Worry: Not on file    Inability: Not on file  . Transportation needs    Medical: Not on file    Non-medical: Not on file  Tobacco Use  . Smoking status: Never Smoker  . Smokeless tobacco: Never Used  Substance and Sexual Activity  .  Alcohol use: No    Alcohol/week: 0.0 standard drinks  . Drug use: No  . Sexual activity: Yes    Partners: Male    Birth control/protection: I.U.D.  Lifestyle  . Physical activity    Days per week: Not on file    Minutes per session: Not on file  . Stress: Not on file  Relationships  . Social Herbalist on phone: Not on file    Gets together: Not on file    Attends religious service: Not on file    Active member of club or organization: Not on file    Attends meetings of clubs or organizations: Not on file    Relationship status: Not on file  . Intimate partner violence    Fear of current or ex partner: Not on file    Emotionally abused: Not on file    Physically abused: Not on file    Forced sexual activity: Not on file  Other Topics Concern  . Not on file  Social History Narrative   Married since 1995   Lives in house, two stories, 6 persons live in home, no pets   No exercise     Past Surgical History:  Procedure Laterality Date  . EXCISION METACARPAL MASS Right 08/06/2018   Procedure: EXCISION OF INDEX FINGER MASS;  Surgeon: Milly Jakob, MD;  Location: [place];  Service: Orthopedics;  Laterality: Right;  LENGTH: 30 MINS  . WISDOM TOOTH EXTRACTION      Family History  Problem Relation Age of Onset  . Cancer Mother        lung  . Heart attack Father   . Diabetes Father   . Hyperlipidemia Father     No Active Allergies  Current Outpatient Medications on File Prior to Visit  Medication Sig Dispense Refill  . ibuprofen (ADVIL) 200 MG tablet Take 3 tablets (600 mg total) by mouth every 6 (six) hours.  0  . levothyroxine (SYNTHROID) 100 MCG tablet Take 1 tablet (100 mcg total) by mouth daily. 90 tablet 3  . Multiple Vitamins-Minerals (WOMENS MULTIVITAMIN PO) Take by mouth daily.    . simvastatin (ZOCOR) 20 MG tablet Take 1 tablet (20 mg total) by mouth at bedtime. 90 tablet 3  . telmisartan (MICARDIS) 40 MG tablet Take 1 tablet (40  mg total) by mouth daily. 90 tablet 1   No current facility-administered medications on file prior to visit.     There were no vitals taken for this visit.      Objective:   Physical Exam  General Mental Status- Alert. General Appearance- Not in acute distress.   Skin General: Color- Normal Color. Moisture- Normal Moisture.  Neck Carotid Arteries- Normal color. Moisture- Normal Moisture. No carotid bruits. No JVD.  Chest and Lung Exam Auscultation: Breath Sounds:-Normal.  Cardiovascular Auscultation:Rythm- Regular. Murmurs & Other Heart Sounds:Auscultation of the heart reveals- No Murmurs.  Abdomen Inspection:-Inspeection Normal. Palpation/Percussion:Note:No mass. Palpation and Percussion of the abdomen reveal- Non Tender, Non Distended + BS, no rebound or guarding.   Neurologic Cranial Nerve exam:- CN III-XII intact(No nystagmus), symmetric smile. Normal/IntactStrength:- 5/5 equal and symmetric strength both upper and lower extremities.  Skin- large skin tag. Rt flank/ hip area.      Assessment & Plan:  For you wellness exam today I have ordered cbc, cmp, tsh and lipid panel  Flu vaccine today.  Recommend exercise and healthy diet.  We will let you know lab results as they come in.  Follow up date appointment will be determined after lab review.  Mackie Pai, PA-C

## 2019-06-24 NOTE — Patient Instructions (Addendum)
For you wellness exam today I have ordered cbc, cmp, tsh and lipid panel  Flu vaccine today.  Recommend exercise and healthy diet.  We will let you know lab results as they come in.  Follow up date appointment will be determined after lab review.   Let me know when you want me to put in colonoscopy referral. Discussed new guidelines.  Skin lesion/probable large skin tag size of marble can schedule for removal. Your want to call your insurance to find out if they cover. Then can schedule.   Preventive Care 105-7 Years Old, Female Preventive care refers to visits with your health care provider and lifestyle choices that can promote health and wellness. This includes:  A yearly physical exam. This may also be called an annual well check.  Regular dental visits and eye exams.  Immunizations.  Screening for certain conditions.  Healthy lifestyle choices, such as eating a healthy diet, getting regular exercise, not using drugs or products that contain nicotine and tobacco, and limiting alcohol use. What can I expect for my preventive care visit? Physical exam Your health care provider will check your:  Height and weight. This may be used to calculate body mass index (BMI), which tells if you are at a healthy weight.  Heart rate and blood pressure.  Skin for abnormal spots. Counseling Your health care provider may ask you questions about your:  Alcohol, tobacco, and drug use.  Emotional well-being.  Home and relationship well-being.  Sexual activity.  Eating habits.  Work and work Statistician.  Method of birth control.  Menstrual cycle.  Pregnancy history. What immunizations do I need?  Influenza (flu) vaccine  This is recommended every year. Tetanus, diphtheria, and pertussis (Tdap) vaccine  You may need a Td booster every 10 years. Varicella (chickenpox) vaccine  You may need this if you have not been vaccinated. Zoster (shingles) vaccine  You may need  this after age 55. Measles, mumps, and rubella (MMR) vaccine  You may need at least one dose of MMR if you were born in 1957 or later. You may also need a second dose. Pneumococcal conjugate (PCV13) vaccine  You may need this if you have certain conditions and were not previously vaccinated. Pneumococcal polysaccharide (PPSV23) vaccine  You may need one or two doses if you smoke cigarettes or if you have certain conditions. Meningococcal conjugate (MenACWY) vaccine  You may need this if you have certain conditions. Hepatitis A vaccine  You may need this if you have certain conditions or if you travel or work in places where you may be exposed to hepatitis A. Hepatitis B vaccine  You may need this if you have certain conditions or if you travel or work in places where you may be exposed to hepatitis B. Haemophilus influenzae type b (Hib) vaccine  You may need this if you have certain conditions. Human papillomavirus (HPV) vaccine  If recommended by your health care provider, you may need three doses over 6 months. You may receive vaccines as individual doses or as more than one vaccine together in one shot (combination vaccines). Talk with your health care provider about the risks and benefits of combination vaccines. What tests do I need? Blood tests  Lipid and cholesterol levels. These may be checked every 5 years, or more frequently if you are over 6 years old.  Hepatitis C test.  Hepatitis B test. Screening  Lung cancer screening. You may have this screening every year starting at age 39 if you  have a 30-pack-year history of smoking and currently smoke or have quit within the past 15 years.  Colorectal cancer screening. All adults should have this screening starting at age 46 and continuing until age 57. Your health care provider may recommend screening at age 79 if you are at increased risk. You will have tests every 1-10 years, depending on your results and the type of  screening test.  Diabetes screening. This is done by checking your blood sugar (glucose) after you have not eaten for a while (fasting). You may have this done every 1-3 years.  Mammogram. This may be done every 1-2 years. Talk with your health care provider about when you should start having regular mammograms. This may depend on whether you have a family history of breast cancer.  BRCA-related cancer screening. This may be done if you have a family history of breast, ovarian, tubal, or peritoneal cancers.  Pelvic exam and Pap test. This may be done every 3 years starting at age 60. Starting at age 42, this may be done every 5 years if you have a Pap test in combination with an HPV test. Other tests  Sexually transmitted disease (STD) testing.  Bone density scan. This is done to screen for osteoporosis. You may have this scan if you are at high risk for osteoporosis. Follow these instructions at home: Eating and drinking  Eat a diet that includes fresh fruits and vegetables, whole grains, lean protein, and low-fat dairy.  Take vitamin and mineral supplements as recommended by your health care provider.  Do not drink alcohol if: ? Your health care provider tells you not to drink. ? You are pregnant, may be pregnant, or are planning to become pregnant.  If you drink alcohol: ? Limit how much you have to 0-1 drink a day. ? Be aware of how much alcohol is in your drink. In the [place] one drink equals one 12 oz bottle of beer (355 mL), one 5 oz glass of wine (148 mL), or one 1 oz glass of hard liquor (44 mL). Lifestyle  Take daily care of your teeth and gums.  Stay active. Exercise for at least 30 minutes on 5 or more days each week.  Do not use any products that contain nicotine or tobacco, such as cigarettes, e-cigarettes, and chewing tobacco. If you need help quitting, ask your health care provider.  If you are sexually active, practice safe sex. Use a condom or other form of birth  control (contraception) in order to prevent pregnancy and STIs (sexually transmitted infections).  If told by your health care provider, take low-dose aspirin daily starting at age 48. What's next?  Visit your health care provider once a year for a well check visit.  Ask your health care provider how often you should have your eyes and teeth checked.  Stay up to date on all vaccines. This information is not intended to replace advice given to you by your health care provider. Make sure you discuss any questions you have with your health care provider. Document Released: 09/04/2015 Document Revised: 04/19/2018 Document Reviewed: 04/19/2018 Elsevier Patient Education  2020 [employer].

## 2019-06-25 ENCOUNTER — Encounter: Payer: Self-pay | Admitting: Medical

## 2019-07-02 DIAGNOSIS — E559 Vitamin D deficiency, unspecified: Secondary | ICD-10-CM | POA: Insufficient documentation

## 2019-10-18 ENCOUNTER — Other Ambulatory Visit: Payer: Self-pay | Admitting: Medical

## 2019-10-21 MED ORDER — AMLODIPINE BESYLATE 5 MG PO TABS: 5.0000 mg | ORAL_TABLET | Freq: Every day | ORAL | 0 refills | Status: DC

## 2019-10-21 NOTE — Addendum Note (Signed)
Addended by: Jeronimo Greaves on: 10/21/2019 08:54 AM   Modules accepted: Orders

## 2019-10-22 ENCOUNTER — Other Ambulatory Visit: Payer: Self-pay

## 2019-10-22 MED ORDER — AMLODIPINE BESYLATE 5 MG PO TABS: 5.0000 mg | ORAL_TABLET | Freq: Every day | ORAL | 0 refills | Status: DC

## 2019-12-07 IMAGING — DX DG FINGER INDEX 2+V*R*
3 series · 3 of 3 positions shown · non-contrast
Comparison: Hand films of 04/19/2017

CLINICAL DATA: Nodule between the PIP and MCP of the second digit
over the last 2 months

EXAM:
RIGHT INDEX FINGER 2+V

[finger ap]
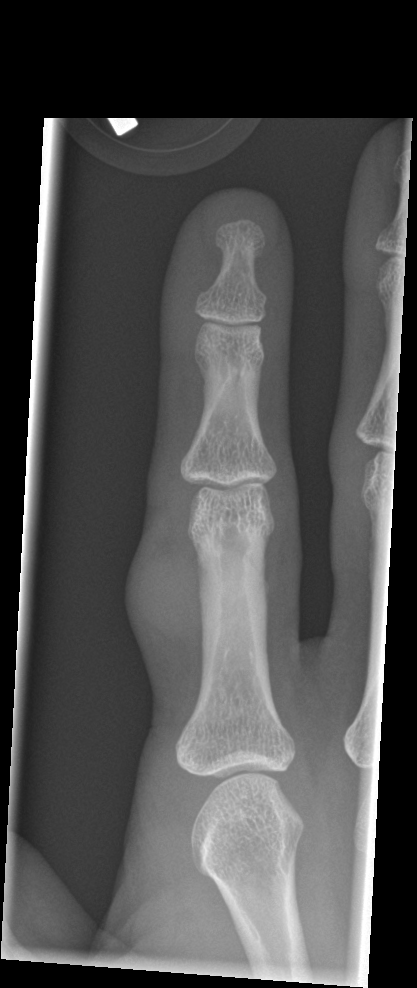

[finger obl]
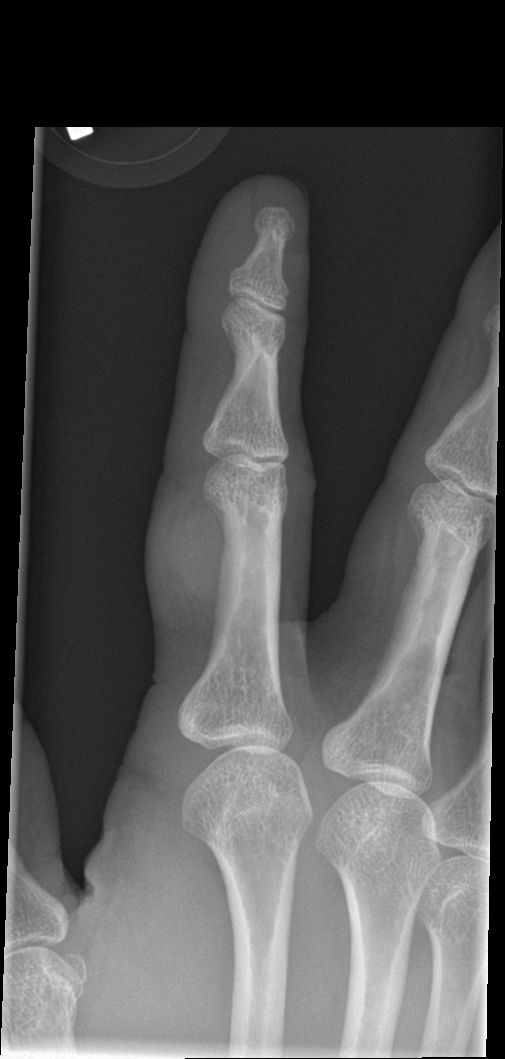

[finger lat]
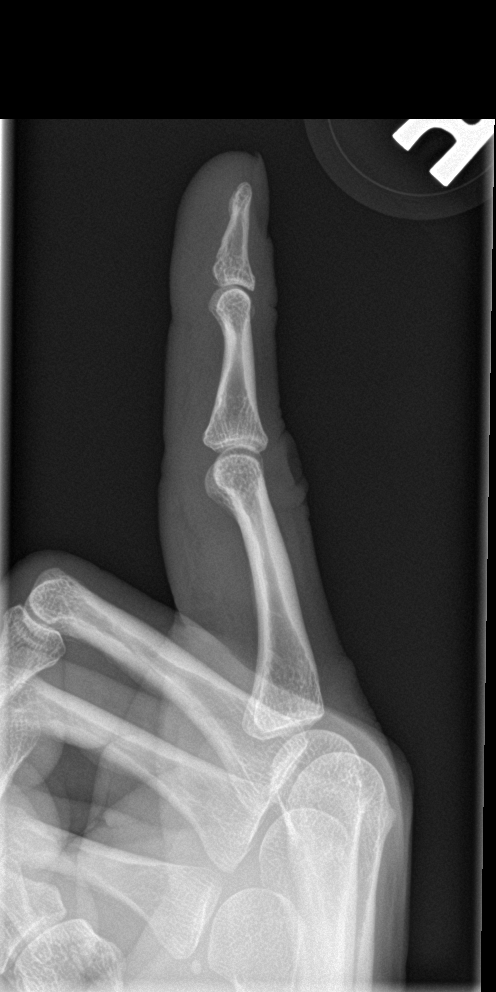

[3 of 3 positions shown; findings below may reference images not displayed]

FINDINGS: There is definite focal soft tissue prominence adjacent to the
proximal phalanx of the right second digit on the radial aspect. No
underlying bony abnormality is seen. The joint spaces appear normal.
This could represent benign process such as ganglion cyst and
ultrasound may be helpful to assess further if warranted.
IMPRESSION: Rounded soft tissue structure along the radial aspect of the right
second digit at the level of the mid proximal phalanx. Possible
ganglion cyst. Consider ultrasound.

## 2019-12-10 IMAGING — DX DG CHEST 2V
2 series · 2 of 2 positions shown · non-contrast
Comparison: None.

CLINICAL DATA: Positive PPD

EXAM:
CHEST - 2 VIEW

[chest pa]
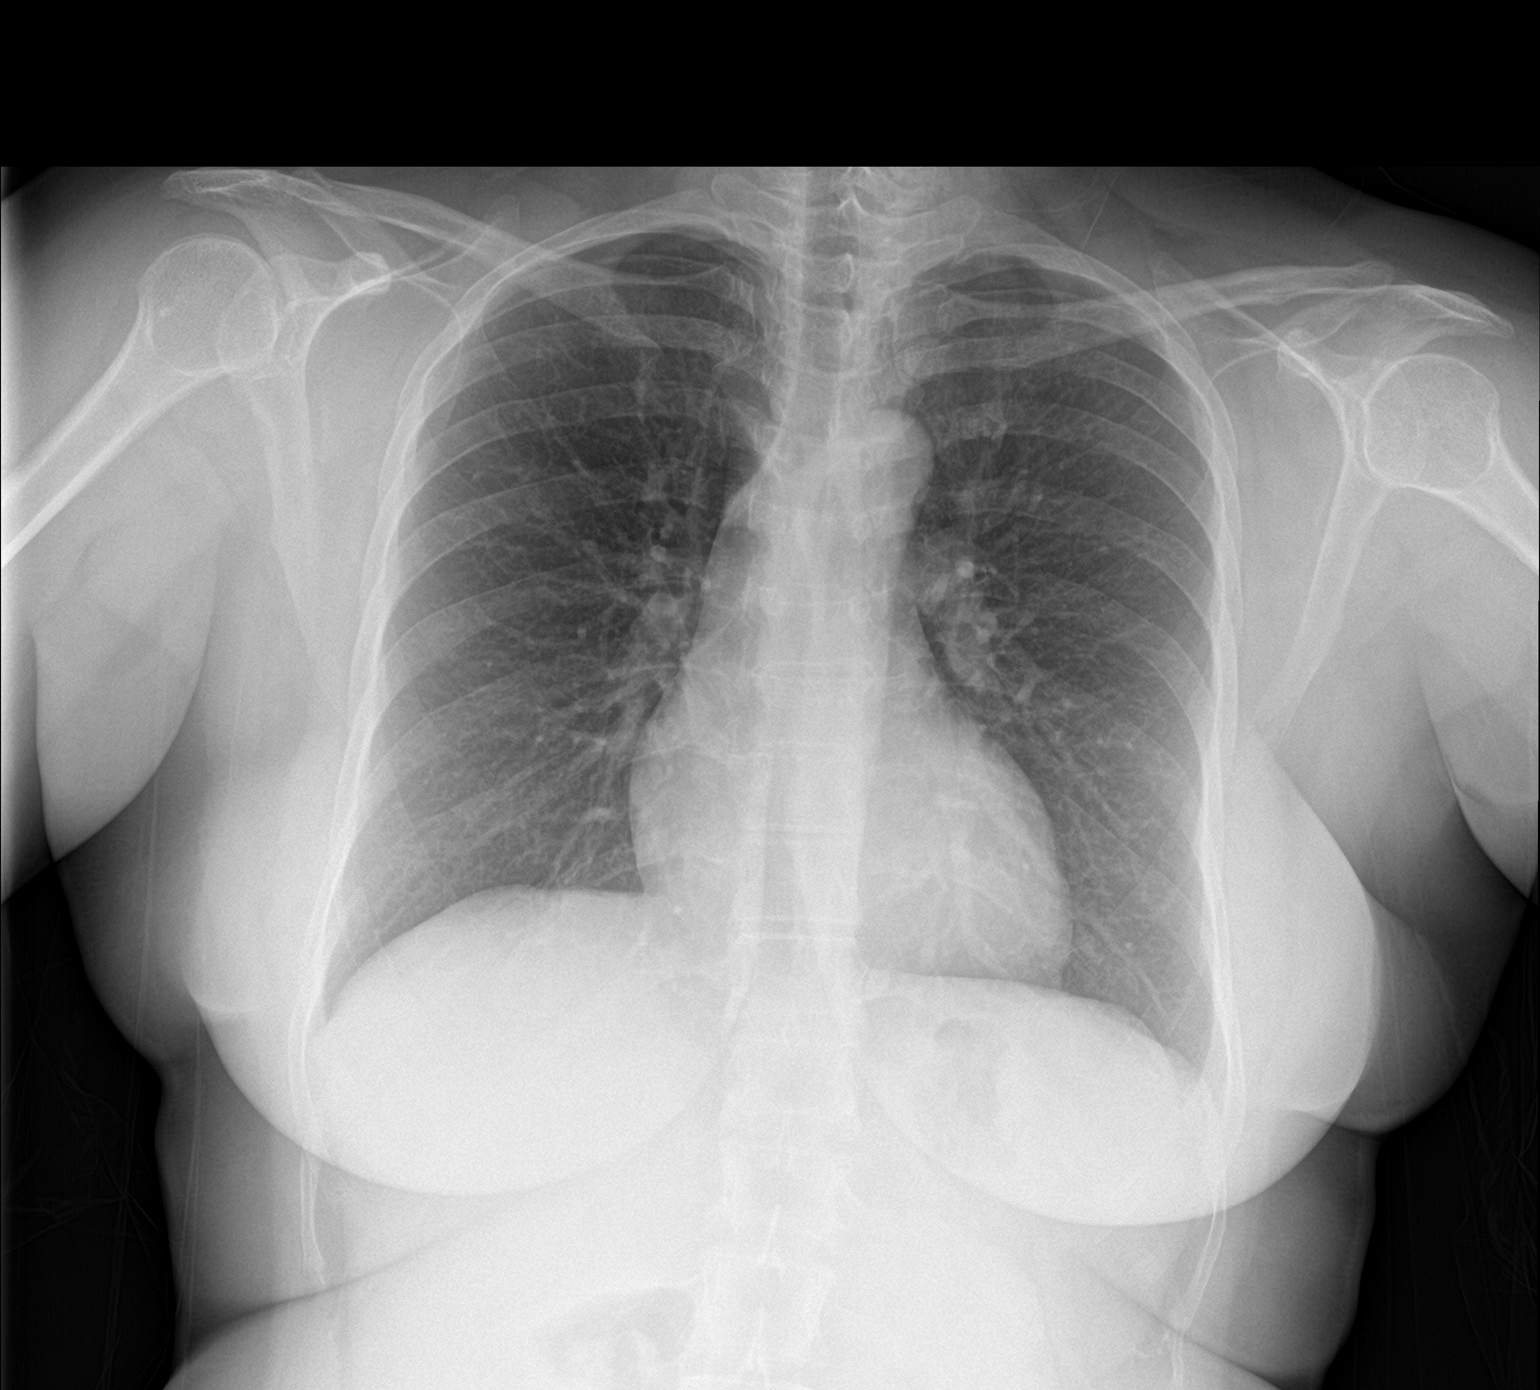

[chest lat]
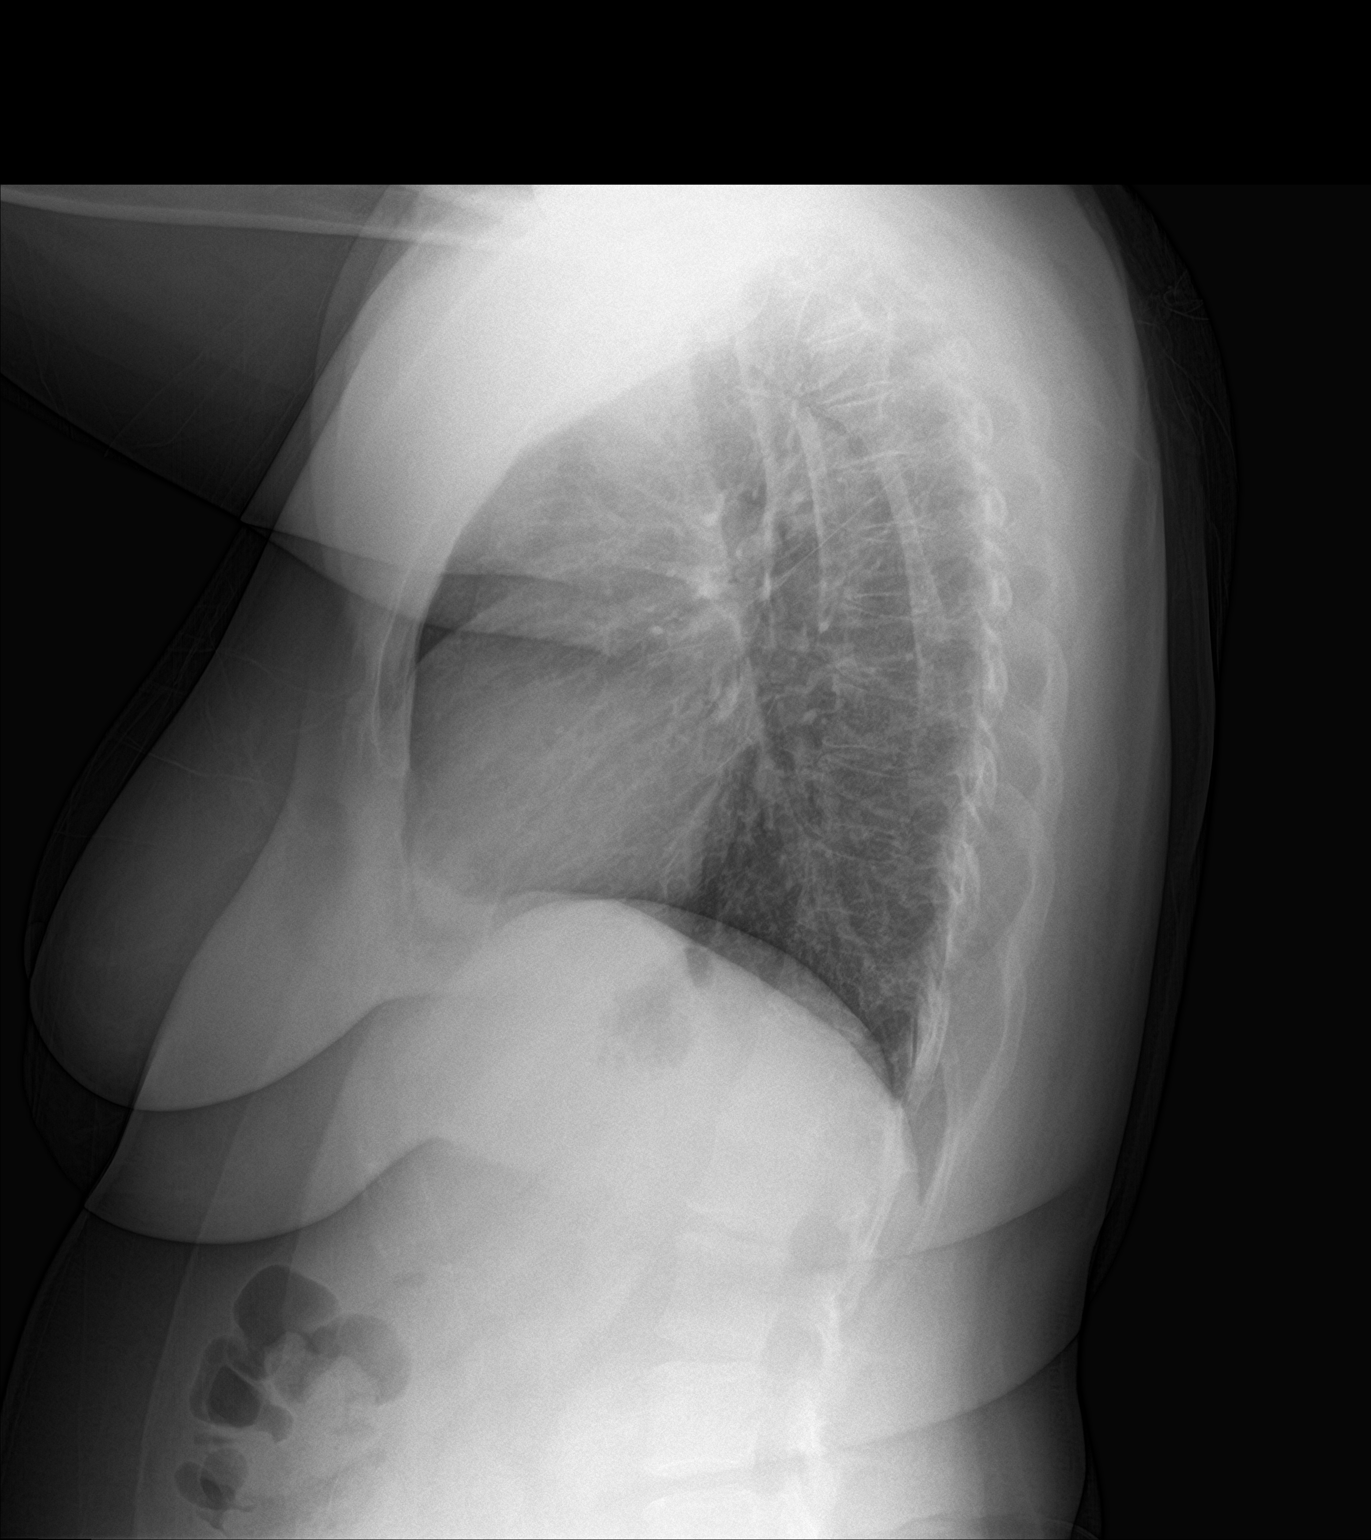

[2 of 2 positions shown; findings below may reference images not displayed]

FINDINGS: The heart size and mediastinal contours are within normal limits.
Both lungs are clear. The visualized skeletal structures are
unremarkable.
IMPRESSION: No acute abnormality noted.  No signs of prior tuberculosis

## 2020-02-03 ENCOUNTER — Other Ambulatory Visit: Payer: Self-pay | Admitting: Medical

## 2020-03-28 IMAGING — US US TRANSVAGINAL NON-OB
1 series · 13 of 25 positions shown · non-contrast
Comparison: None

CLINICAL DATA: Dysfunctional uterine bleeding

EXAM:
TRANSABDOMINAL AND TRANSVAGINAL ULTRASOUND OF PELVIS
TECHNIQUE: Both transabdominal and transvaginal ultrasound examinations of the
pelvis were performed. Transabdominal technique was performed for
global imaging of the pelvis including uterus, ovaries, adnexal
regions, and pelvic cul-de-sac. It was necessary to proceed with
endovaginal exam following the transabdominal exam to visualize the
endometrium uterus and ovaries.

[Series 1: us transvaginal non-ob · 0.27mm/px · 13 of 52 slices shown]
[im 1/52]
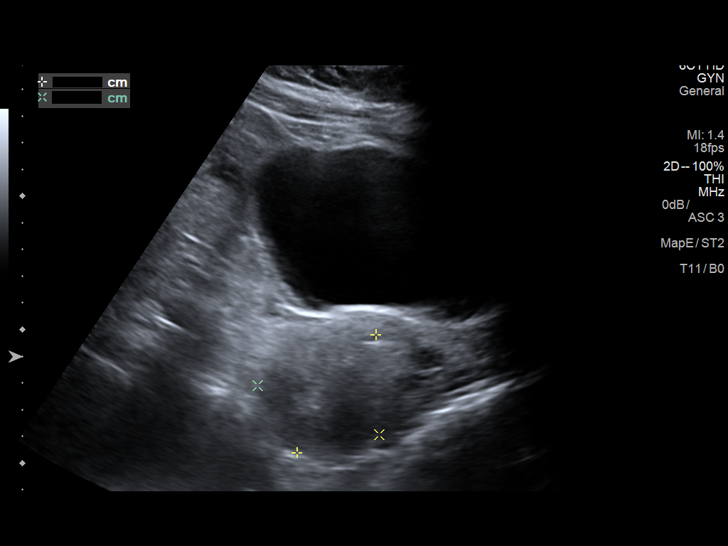
[im 5/52]
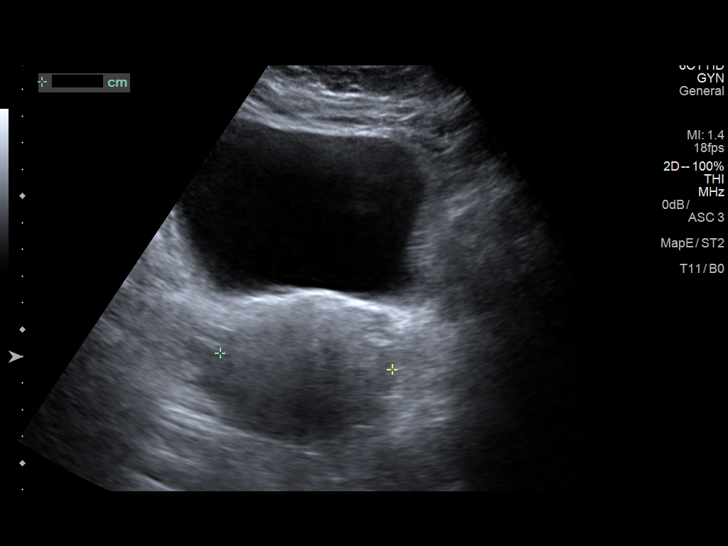
[im 9/52]
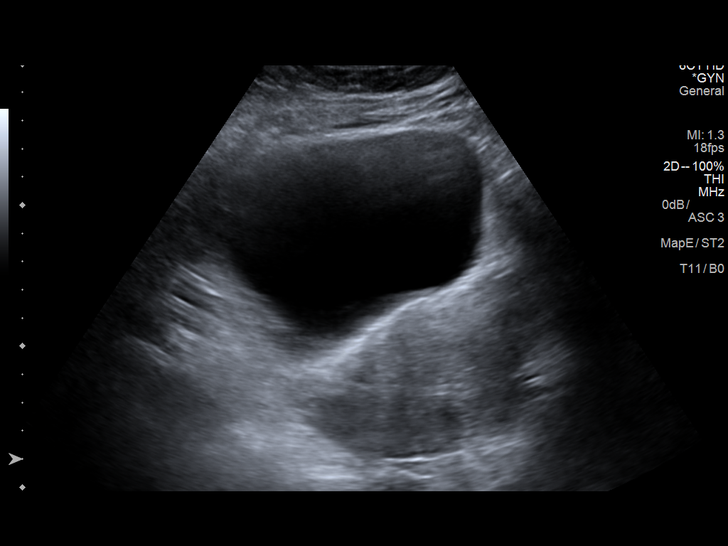
[im 13/52]
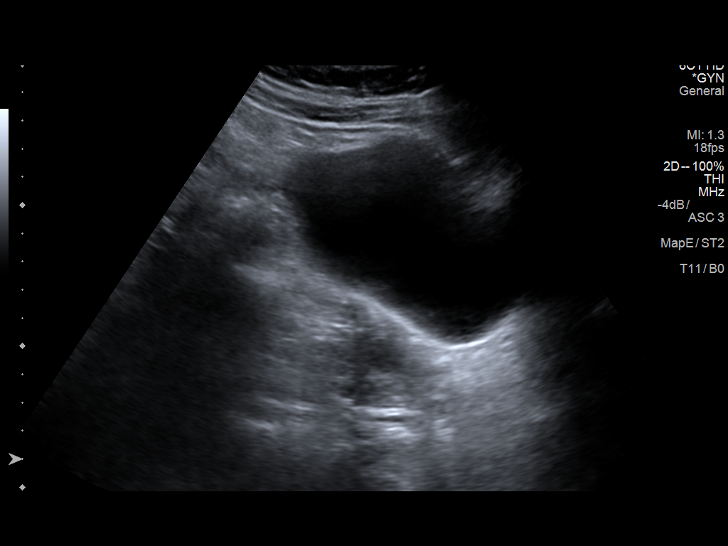
[im 18/52]
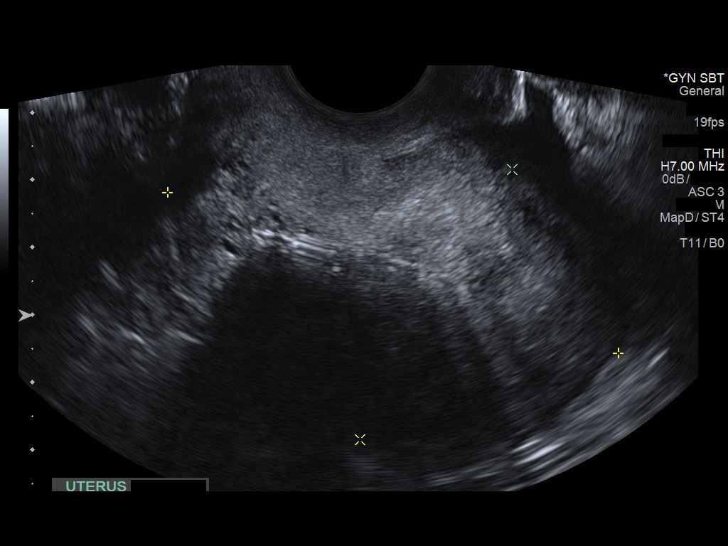
[im 22/52]
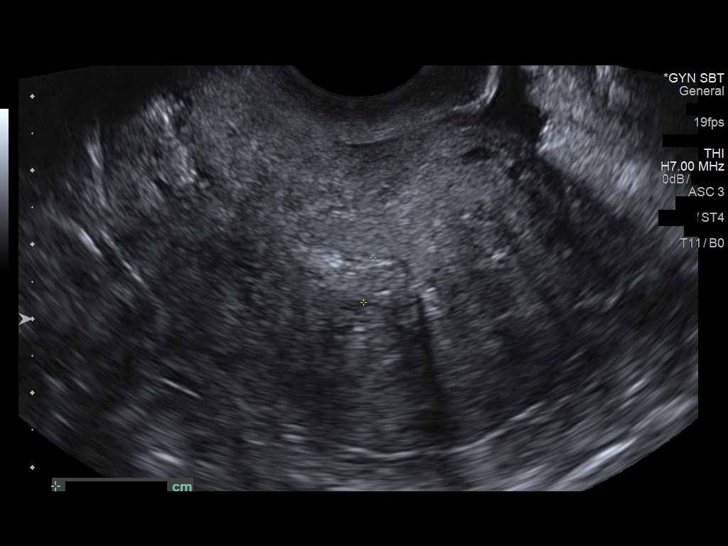
[im 26/52]
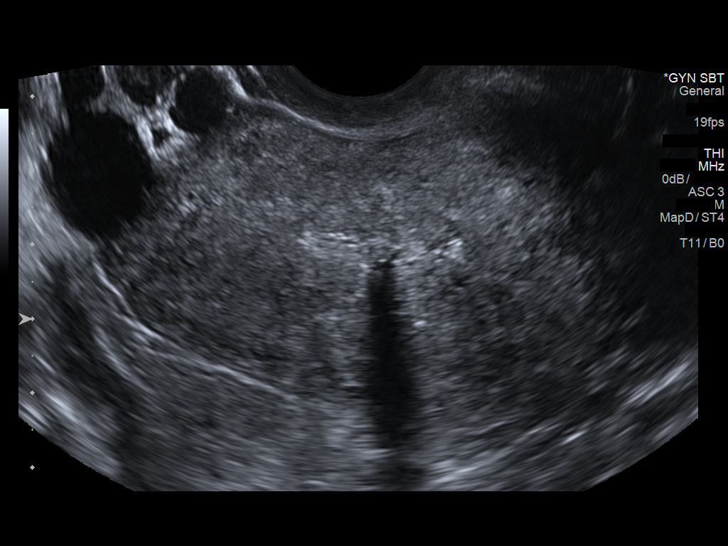
[im 30/52]
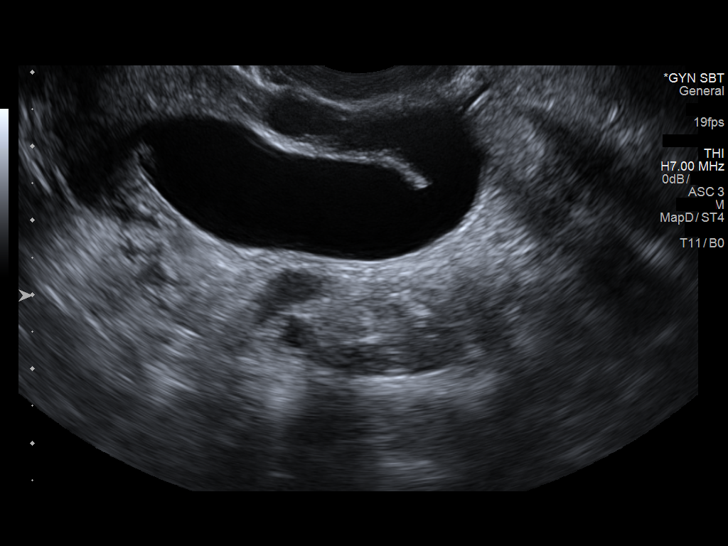
[im 35/52]
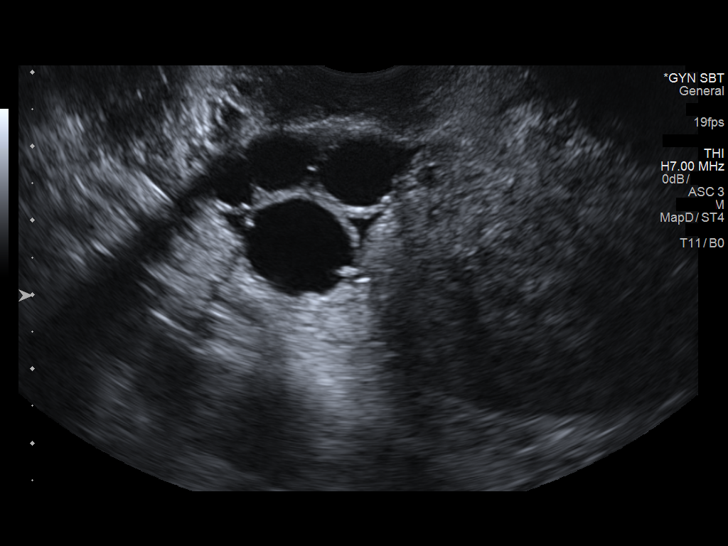
[im 39/52]
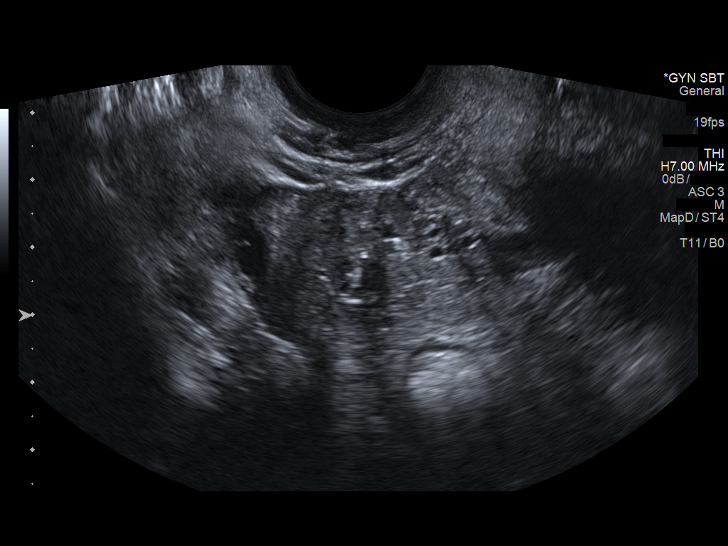
[im 43/52]
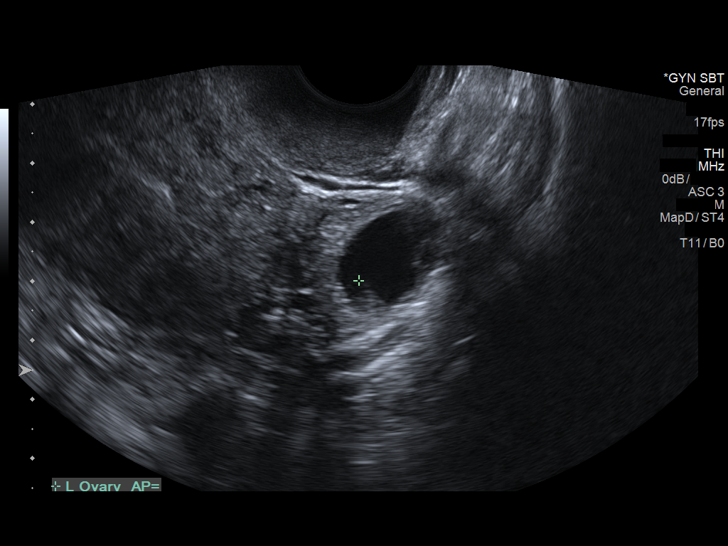
[im 47/52]
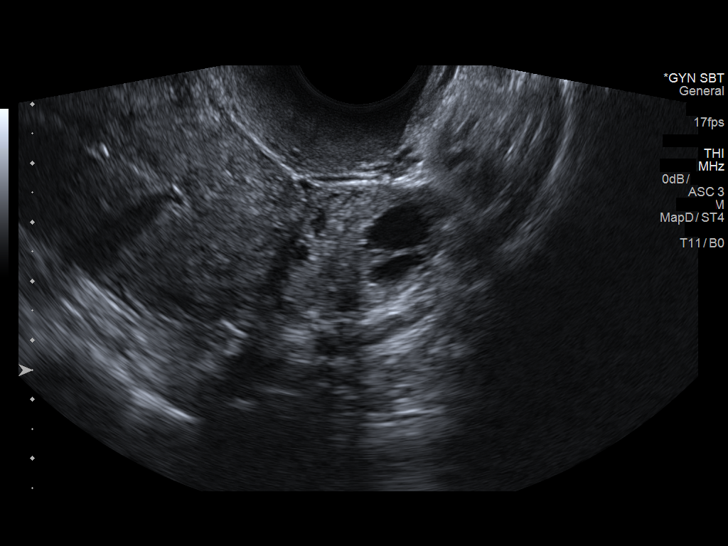
[im 52/52]
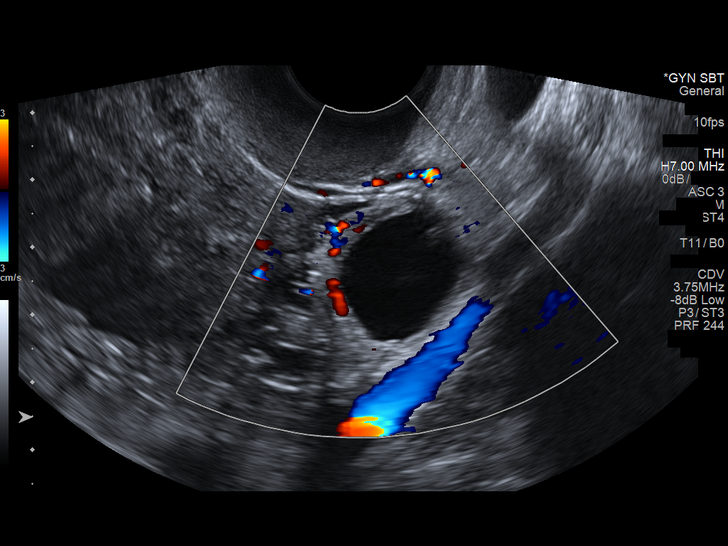

[13 of 25 positions shown; findings below may reference images not displayed]

FINDINGS: Uterus

Measurements: 7.1 cm length by 4.6 cm height by 6.5 cm wide =
volume: 111 mL. No fibroids or other mass visualized.

Endometrium

Thickness: 6 mm. Intrauterine device appears within the endometrial
canal.

Right ovary

Measurements: 3.1 x 1.5 x 2.2 cm = volume: 5.2 mL. Normal
appearance/no adnexal mass.

Left ovary

Measurements: 3.6 x 2.7 x 2 8 cm = volume: 14.3 mL. Normal
appearance/no adnexal mass. Septated cyst measuring less than 3 cm
in the left ovary.

Other findings

Dilated tubular structure in the right adnexa consistent with a
hydrosalpinx.
IMPRESSION: 1. Endometrial thickness of 6 mm. If bleeding remains unresponsive
to hormonal or medical therapy, sonohysterogram should be considered
for focal lesion work-up. (Ref: Radiological Reasoning: Algorithmic
Workup of Abnormal Vaginal Bleeding with Endovaginal Sonography and
Sonohysterography. [id number]; 191:S68-73)
2. Intrauterine device present within the endometrial canal, grossly
appropriate position by sonography
3. Right hydrosalpinx

## 2020-04-16 DIAGNOSIS — Z789 Other specified health status: Secondary | ICD-10-CM | POA: Insufficient documentation

## 2020-05-07 ENCOUNTER — Other Ambulatory Visit: Payer: Self-pay | Admitting: Medical

## 2020-08-03 ENCOUNTER — Other Ambulatory Visit: Payer: Self-pay | Admitting: Medical

## 2021-07-20 DIAGNOSIS — E538 Deficiency of other specified B group vitamins: Secondary | ICD-10-CM | POA: Insufficient documentation

## 2021-07-20 LAB — RESULTS CONSOLE HPV: CHL HPV: NEGATIVE

## 2021-07-20 LAB — HM PAP SMEAR

## 2022-02-21 DIAGNOSIS — N939 Abnormal uterine and vaginal bleeding, unspecified: Secondary | ICD-10-CM | POA: Insufficient documentation

## 2022-12-20 ENCOUNTER — Ambulatory Visit (HOSPITAL_BASED_OUTPATIENT_CLINIC_OR_DEPARTMENT_OTHER)
Admission: RE | Admit: 2022-12-20 | Discharge: 2022-12-20 | Disposition: A | Discharge status: Home / Self Care | Payer: BC Managed Care – PPO | Source: Ambulatory Visit | Attending: Family Medicine | Admitting: Family Medicine

## 2022-12-20 ENCOUNTER — Encounter: Payer: Self-pay | Admitting: Family Medicine

## 2022-12-20 ENCOUNTER — Ambulatory Visit (INDEPENDENT_AMBULATORY_CARE_PROVIDER_SITE_OTHER): Payer: BC Managed Care – PPO | Admitting: Family Medicine

## 2022-12-20 ENCOUNTER — Encounter (HOSPITAL_BASED_OUTPATIENT_CLINIC_OR_DEPARTMENT_OTHER): Payer: Self-pay

## 2022-12-20 VITALS — BP 120/70 | HR 65 | Ht <= 58 in | Wt 160.0 lb

## 2022-12-20 DIAGNOSIS — Z1211 Encounter for screening for malignant neoplasm of colon: Secondary | ICD-10-CM

## 2022-12-20 DIAGNOSIS — Z1231 Encounter for screening mammogram for malignant neoplasm of breast: Secondary | ICD-10-CM | POA: Insufficient documentation

## 2022-12-20 DIAGNOSIS — I10 Essential (primary) hypertension: Secondary | ICD-10-CM | POA: Diagnosis not present

## 2022-12-20 DIAGNOSIS — E559 Vitamin D deficiency, unspecified: Secondary | ICD-10-CM | POA: Diagnosis not present

## 2022-12-20 DIAGNOSIS — E538 Deficiency of other specified B group vitamins: Secondary | ICD-10-CM | POA: Diagnosis not present

## 2022-12-20 DIAGNOSIS — Z0000 Encounter for general adult medical examination without abnormal findings: Secondary | ICD-10-CM

## 2022-12-20 DIAGNOSIS — Z975 Presence of (intrauterine) contraceptive device: Secondary | ICD-10-CM | POA: Diagnosis not present

## 2022-12-20 DIAGNOSIS — E785 Hyperlipidemia, unspecified: Secondary | ICD-10-CM

## 2022-12-20 DIAGNOSIS — E039 Hypothyroidism, unspecified: Secondary | ICD-10-CM | POA: Diagnosis not present

## 2022-12-20 LAB — CBC WITH DIFFERENTIAL/PLATELET
Basophils Absolute: 0 10*3/uL (ref 0.0–0.1)
Basophils Relative: 0.5 % (ref 0.0–3.0)
Eosinophils Absolute: 0 10*3/uL (ref 0.0–0.7)
Eosinophils Relative: 0.8 % (ref 0.0–5.0)
HCT: 41.2 % (ref 36.0–46.0)
Hemoglobin: 13.8 g/dL (ref 12.0–15.0)
Lymphocytes Relative: 36.2 % (ref 12.0–46.0)
Lymphs Abs: 1.9 10*3/uL (ref 0.7–4.0)
MCHC: 33.6 g/dL (ref 30.0–36.0)
MCV: 85.1 fl (ref 78.0–100.0)
Monocytes Absolute: 0.4 10*3/uL (ref 0.1–1.0)
Monocytes Relative: 8.6 % (ref 3.0–12.0)
Neutro Abs: 2.8 10*3/uL (ref 1.4–7.7)
Neutrophils Relative %: 53.9 % (ref 43.0–77.0)
Platelets: 262 10*3/uL (ref 150.0–400.0)
RBC: 4.84 Mil/uL (ref 3.87–5.11)
RDW: 13 % (ref 11.5–15.5)
WBC: 5.2 10*3/uL (ref 4.0–10.5)

## 2022-12-20 LAB — LIPID PANEL
Cholesterol: 148 mg/dL (ref 0–200)
HDL: 46.8 mg/dL (ref >39.00)
LDL Cholesterol: 73 mg/dL (ref 0–99)
NonHDL: 101.05
Total CHOL/HDL Ratio: 3
Triglycerides: 138 mg/dL (ref 0.0–149.0)
VLDL: 27.6 mg/dL (ref 0.0–40.0)

## 2022-12-20 LAB — COMPREHENSIVE METABOLIC PANEL
ALT: 20 U/L (ref 0–35)
AST: 15 U/L (ref 0–37)
Albumin: 3.9 g/dL (ref 3.5–5.2)
Alkaline Phosphatase: 64 U/L (ref 39–117)
BUN: 7 mg/dL (ref 6–23)
CO2: 25 mEq/L (ref 19–32)
Calcium: 9.1 mg/dL (ref 8.4–10.5)
Chloride: 105 mEq/L (ref 96–112)
Creatinine, Ser: 0.65 mg/dL (ref 0.40–1.20)
GFR: 103.06 mL/min (ref >60.00)
Glucose, Bld: 78 mg/dL (ref 70–99)
Potassium: 4.3 mEq/L (ref 3.5–5.1)
Sodium: 138 mEq/L (ref 135–145)
Total Bilirubin: 0.4 mg/dL (ref 0.2–1.2)
Total Protein: 6.8 g/dL (ref 6.0–8.3)

## 2022-12-20 LAB — VITAMIN B12: Vitamin B-12: 1161 pg/mL — ABNORMAL HIGH (ref 211–911)

## 2022-12-20 LAB — TSH: TSH: 0.63 u[IU]/mL (ref 0.35–5.50)

## 2022-12-20 LAB — VITAMIN D 25 HYDROXY (VIT D DEFICIENCY, FRACTURES): VITD: 31.49 ng/mL (ref 30.00–100.00)

## 2022-12-20 MED ORDER — TELMISARTAN 40 MG PO TABS
40.0000 mg | ORAL_TABLET | Freq: Every day | ORAL | 0 refills | Status: AC
Start: 2022-12-20 — End: ?

## 2022-12-20 MED ORDER — AMLODIPINE BESYLATE 5 MG PO TABS
5.0000 mg | ORAL_TABLET | Freq: Every day | ORAL | 0 refills | Status: DC
Start: 2022-12-20 — End: 2024-07-01

## 2022-12-20 MED ORDER — SIMVASTATIN 20 MG PO TABS: 20.0000 mg | ORAL_TABLET | Freq: Every day | ORAL | 3 refills | Status: DC

## 2022-12-20 NOTE — Assessment & Plan Note (Signed)
Medication management: continue simvastatin Lifestyle factors for lowering cholesterol include: Diet therapy - heart-healthy diet rich in fruits, veggies, fiber-rich whole grains, lean meats, chicken, fish (at least twice a week), fat-free or 1% dairy products; foods low in saturated/trans fats, cholesterol, sodium, and sugar. Mediterranean diet has shown to be very heart healthy. Regular exercise - recommend at least 30 minutes a day, 5 times per week Weight management  Repeat CMP and lipid panel today

## 2022-12-20 NOTE — Assessment & Plan Note (Signed)
Currently not consistent with supplementation. Checking labs today.

## 2022-12-20 NOTE — Progress Notes (Signed)
New Patient Office Visit  Subjective    Patient ID: Rebekah Knox, female    DOB: Jun 30, 1971  Age: 52 y.o. MRN: [id number]  CC:  Chief Complaint  Patient presents with   Establish Care    HPI Rebekah Knox presents to establish care. She has not had primary care in a few years. 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  Reports she has an IUD (placed last year at [employer]). States history of fibroids. She would like to transfer to [facility] for routine women's health; no acute concerns.   Hypertension: - Medications: amlodipine 5 mg daily, telmisartan 40 mg daily - Compliance: good - Checking BP at home: not usually - Denies any SOB, recurrent headaches, CP, vision changes, LE edema, dizziness, palpitations, or medication side effects. - Diet: general - Exercise: walking, active at work  B12 Deficiency: - not taking regular supplementation  Vitamin D Deficiency: - not taking regular supplementation   Hypothyroidism: - Management: levothyroxine 100 mcg daily -Taking medications as prescribed in the morning, apart from other foods, meds, vitamins, etc.  -No recent changes to hair, skin, nails, energy levels - She is following with Dr. Allena Katz at [facility]  Hyperlipidemia: - medications: simvastatin 20 mg daily  - compliance: good - medication SEs: none The 10-year ASCVD risk score (Arnett DK, et al., 2019) is: 1.3%   Values used to calculate the score:     Age: 66 years     Sex: Female     Is Non-Hispanic African American: No     Diabetic: No     Tobacco smoker: No     Systolic Blood Pressure: 120 mmHg     Is BP treated: Yes     HDL Cholesterol: 49 MG/DL     Total Cholesterol: 177 MG/DL        Outpatient Encounter Medications as of 12/20/2022  Medication Sig   Cholecalciferol (VITAMIN D3) 50 MCG (2000 UT) capsule Take 2,000 Units by mouth daily.   cyanocobalamin (VITAMIN  B12) 1000 MCG tablet Take 1,000 mcg by mouth daily.   levothyroxine (SYNTHROID) 100 MCG tablet Take 1 tablet (100 mcg total) by mouth daily.   Multiple Vitamins-Minerals (WOMENS MULTIVITAMIN PO) Take by mouth daily.   [DISCONTINUED] amLODipine (NORVASC) 5 MG tablet Take 1 tablet (5 mg total) by mouth daily.   [DISCONTINUED] simvastatin (ZOCOR) 20 MG tablet Take 1 tablet (20 mg total) by mouth at bedtime.   [DISCONTINUED] telmisartan (MICARDIS) 40 MG tablet Take 1 tablet by mouth once daily   amLODipine (NORVASC) 5 MG tablet Take 1 tablet (5 mg total) by mouth daily.   simvastatin (ZOCOR) 20 MG tablet Take 1 tablet (20 mg total) by mouth at bedtime.   telmisartan (MICARDIS) 40 MG tablet Take 1 tablet (40 mg total) by mouth daily.   [DISCONTINUED] ibuprofen (ADVIL) 200 MG tablet Take 3 tablets (600 mg total) by mouth every 6 (six) hours.   No facility-administered encounter medications on file as of 12/20/2022.    Past Medical History:  Diagnosis Date   Dysmenorrhea    Hyperlipidemia    Thyroid disease     Past Surgical History:  Procedure Laterality Date   EXCISION METACARPAL MASS Right 08/06/2018   Procedure: EXCISION OF INDEX FINGER MASS;  Surgeon: Mack Hook, MD;  Location: [facility];  Service: Orthopedics;  Laterality: Right;  LENGTH: 30 MINS   WISDOM TOOTH  EXTRACTION      Family History  Problem Relation Age of Onset   Cancer Mother        lung   Heart attack Father    Diabetes Father    Hyperlipidemia Father     Social History   Socioeconomic History   Marital status: Married    Spouse name: Not on file   Number of children: Not on file   Years of education: Not on file   Highest education level: Not on file  Occupational History   Not on file  Tobacco Use   Smoking status: Never   Smokeless tobacco: Never  Substance and Sexual Activity   Alcohol use: No    Alcohol/week: 0.0 standard drinks of alcohol   Drug use: No   Sexual activity: Yes     Partners: Male    Birth control/protection: I.U.D.  Other Topics Concern   Not on file  Social History Narrative   Married since 1995   Lives in house, two stories, 6 persons live in home, no pets   No exercise    Social Determinants of Health   Financial Resource Strain: Not on file  Food Insecurity: Not on file  Transportation Needs: Not on file  Physical Activity: Not on file  Stress: Not on file  Social Connections: Not on file  Intimate Partner Violence: Not on file    ROS All review of systems negative except what is listed in the HPI      Objective    BP 120/70   Pulse 65   Ht 4\' 10"  (1.473 m)   Wt 160 lb (72.6 kg)   SpO2 99%   BMI 33.44 kg/m   Physical Exam Vitals reviewed.  Constitutional:      Appearance: Normal appearance.  Cardiovascular:     Rate and Rhythm: Normal rate and regular rhythm.     Pulses: Normal pulses.     Heart sounds: Normal heart sounds.  Pulmonary:     Effort: Pulmonary effort is normal.     Breath sounds: Normal breath sounds.  Musculoskeletal:     Cervical back: Normal range of motion and neck supple. No tenderness.     Right lower leg: No edema.     Left lower leg: No edema.  Lymphadenopathy:     Cervical: No cervical adenopathy.  Skin:    General: Skin is warm and dry.  Neurological:     Mental Status: She is alert and oriented to person, place, and time.  Psychiatric:        Mood and Affect: Mood normal.        Behavior: Behavior normal.        Thought Content: Thought content normal.        Judgment: Judgment normal.         Assessment & Plan:   Problem List Items Addressed This Visit     Hypothyroidism (Chronic)    Previously well controlled Continue Synthroid at current dose  Recheck TSH and adjust Synthroid as indicated Continue following with endocrinology      Relevant Orders   TSH   Hyperlipidemia (Chronic)    Medication management: continue simvastatin Lifestyle factors for lowering  cholesterol include: Diet therapy - heart-healthy diet rich in fruits, veggies, fiber-rich whole grains, lean meats, chicken, fish (at least twice a week), fat-free or 1% dairy products; foods low in saturated/trans fats, cholesterol, sodium, and sugar. Mediterranean diet has shown to be very heart healthy. Regular exercise -  recommend at least 30 minutes a day, 5 times per week Weight management  Repeat CMP and lipid panel today      Relevant Medications   amLODipine (NORVASC) 5 MG tablet   simvastatin (ZOCOR) 20 MG tablet   telmisartan (MICARDIS) 40 MG tablet   Other Relevant Orders   Comprehensive metabolic panel   Lipid panel   Breast cancer screening   Relevant Orders   MM 3D SCREENING MAMMOGRAM BILATERAL BREAST   B12 deficiency (Chronic)    Currently not consistent with supplementation. Checking labs today.       Relevant Orders   Vitamin B12   Essential hypertension - Primary (Chronic)    Blood pressure is at goal for age and co-morbidities.   Recommendations: continue amlodipine and telmisartan - BP goal <130/80 - monitor and log blood pressures at home - check around the same time each day in a relaxed setting - Limit salt to <2000 mg/day - Follow DASH eating plan (heart healthy diet) - limit alcohol to 2 standard drinks per day for men and 1 per day for women - avoid tobacco products - get at least 2 hours of regular aerobic exercise weekly Patient aware of signs/symptoms requiring further/urgent evaluation. Labs updated today.      Relevant Medications   amLODipine (NORVASC) 5 MG tablet   simvastatin (ZOCOR) 20 MG tablet   telmisartan (MICARDIS) 40 MG tablet   Other Relevant Orders   CBC with Differential/Platelet   Comprehensive metabolic panel   Lipid panel   TSH   Vitamin D deficiency (Chronic)    Currently not consistent with supplementation Checking labs today      Relevant Orders   VITAMIN D 25 Hydroxy (Vit-D Deficiency, Fractures)   Other  Visit Diagnoses     Encounter for medical examination to establish care       Colon cancer screening       Relevant Orders   Ambulatory referral to Gastroenterology   IUD (intrauterine device) in place       Relevant Orders   Ambulatory referral to Obstetrics / Gynecology       Return in about 6 months (around 06/21/2023) for routine follow-up.   Clayborne Dana, NP

## 2022-12-20 NOTE — Patient Instructions (Addendum)
Blood pressure is at goal for age and co-morbidities.   Recommendations: continue amlodipine and telmisartan - BP goal <130/80 - monitor and log blood pressures at home - check around the same time each day in a relaxed setting - Limit salt to <2000 mg/day - Follow DASH eating plan (heart healthy diet) - limit alcohol to 2 standard drinks per day for men and 1 per day for women - avoid tobacco products - get at least 2 hours of regular aerobic exercise weekly Patient aware of signs/symptoms requiring further/urgent evaluation. Labs updated today.  High cholesterol: Medication management: continue simvastatin Lifestyle factors for lowering cholesterol include: Diet therapy - heart-healthy diet rich in fruits, veggies, fiber-rich whole grains, lean meats, chicken, fish (at least twice a week), fat-free or 1% dairy products; foods low in saturated/trans fats, cholesterol, sodium, and sugar. Mediterranean diet has shown to be very heart healthy. Regular exercise - recommend at least 30 minutes a day, 5 times per week Weight management  Repeat CMP and lipid panel today  Hypothyroidism: Previously well controlled Continue Synthroid at current dose  Recheck TSH and adjust Synthroid as indicated    ----------------------   Thank you for choosing [facility] for your Primary Care needs. I am excited for the opportunity to partner with you to meet your health care goals. It was a pleasure meeting you today!  Information on diet, exercise, and health maintenance recommendations are listed below. This is information to help you be sure you are on track for optimal health and monitoring.   Please look over this and let us know if you have any questions or if you have completed any of the health maintenance outside of [facility] so that we can be sure your records are up to date.  ___________________________________________________________  MyChart:  For all  urgent or time sensitive needs we ask that you please call the office to avoid delays. Our number is [phone]. MyChart is not constantly monitored and due to the large volume of messages a day, replies may take up to 72 business hours.  MyChart Policy: MyChart allows for you to see your visit notes, after visit summary, provider recommendations, lab and tests results, make an appointment, request refills, and contact your provider or the office for non-urgent questions or concerns. Providers are seeing patients during normal business hours and do not have built in time to review MyChart messages.  We ask that you allow a minimum of 3 business days for responses to [employer]. For this reason, please do not send urgent requests through MyChart. Please call the office at [phone]. New and ongoing conditions may require a visit. We have virtual and in-person visits available for your convenience.  Complex MyChart concerns may require a visit. Your provider may request you schedule a virtual or in-person visit to ensure we are providing the best care possible. MyChart messages sent after 11:00 AM on Friday will not be received by the provider until Monday morning.    Lab and Test Results: You will receive your lab and test results on MyChart as soon as they are completed and results have been sent by the lab or testing facility. Due to this service, you will receive your results BEFORE your provider.  I review lab and test results each morning prior to seeing patients. Some results require collaboration with other providers to ensure you are receiving the most appropriate care. For this reason, we ask that you please allow  a minimum of 3-5 business days from the time that ALL results have been received for your provider to receive and review lab and test results and contact you about these.  Most lab and test result comments from the provider will be sent through MyChart. Your provider may  recommend changes to the plan of care, follow-up visits, repeat testing, ask questions, or request an office visit to discuss these results. You may reply directly to this message or call the office to provide information for the provider or set up an appointment. In some instances, you will be called with test results and recommendations. Please let us know if this is preferred and we will make note of this in your chart to provide this for you.    If you have not heard a response to your lab or test results in 5 business days from all results returning to MyChart, please call the office to let us know. We ask that you please avoid calling prior to this time unless there is an emergent concern. Due to high call volumes, this can delay the resulting process.  After Hours: For all non-emergency after hours needs, please call the office at [phone] and select the option to reach the on-call  service. On-call services are shared between multiple [place] offices and therefore it will not be possible to speak directly with your provider. On-call providers may provide medical advice and recommendations, but are unable to provide refills for maintenance medications.  For all emergency or urgent medical needs after normal business hours, we recommend that you seek care at the closest Urgent Care or Emergency Department to ensure appropriate treatment in a timely manner.  [facility] has a 24 hour emergency room located on the ground floor for your convenience.   Urgent Concerns During the Business Day Providers are seeing patients from 8AM to 5PM with a busy schedule and are most often not able to respond to non-urgent calls until the end of the day or the next business day. If you should have URGENT concerns during the day, please call and speak to the nurse or schedule a same day appointment so that we can address your concern without delay.   Thank you, again, for choosing me as your health  care partner. I appreciate your trust and look forward to learning more about you!   Lollie Marrow Reola Calkins, DNP, FNP-C  ___________________________________________________________  Health Maintenance Recommendations Screening Testing Mammogram Every 1-2 years based on history and risk factors Starting at age 4 Pap Smear Ages 21-39 every 3 years Ages 36-65 every 5 years with HPV testing More frequent testing may be required based on results and history Colon Cancer Screening Every 1-10 years based on test performed, risk factors, and history Starting at age 31 Bone Density Screening Every 2-10 years based on history Starting at age 39 for women Recommendations for men differ based on medication usage, history, and risk factors AAA Screening One time ultrasound Men 84-74 years old who have ever smoked Lung Cancer Screening Low Dose Lung CT every 12 months Age 17-80 years with a 20 pack-year smoking history who still smoke or who have quit within the last 15 years  Screening Labs Routine  Labs: Complete Blood Count (CBC), Complete Metabolic Panel (CMP), Cholesterol (Lipid Panel) Every 6-12 months based on history and medications May be recommended more frequently based on current conditions or previous results Hemoglobin A1c Lab Every 3-12 months based on history and previous results Starting  at age 51 or earlier with diagnosis of diabetes, high cholesterol, BMI >26, and/or risk factors Frequent monitoring for patients with diabetes to ensure blood sugar control Thyroid Panel  Every 6 months based on history, symptoms, and risk factors May be repeated more often if on medication HIV One time testing for all patients 47 and older May be repeated more frequently for patients with increased risk factors or exposure Hepatitis C One time testing for all patients 24 and older May be repeated more frequently for patients with increased risk factors or exposure Gonorrhea,  Chlamydia Every 12 months for all sexually active persons 13-24 years Additional monitoring may be recommended for those who are considered high risk or who have symptoms PSA Men 23-44 years old with risk factors Additional screening may be recommended from age 12-69 based on risk factors, symptoms, and history  Vaccine Recommendations Tetanus Booster All adults every 10 years Flu Vaccine All patients 6 months and older every year COVID Vaccine All patients 12 years and older Initial dosing with booster May recommend additional booster based on age and health history HPV Vaccine 2 doses all patients age 40-26 Dosing may be considered for patients over 26 Shingles Vaccine (Shingrix) 2 doses all adults 50 years and older Pneumonia (Pneumovax 28) All adults 65 years and older May recommend earlier dosing based on health history Pneumonia (Prevnar 32) All adults 65 years and older Dosed 1 year after Pneumovax 23 Pneumonia (Prevnar 20) All adults 65 years and older (adults 19-64 with certain conditions or risk factors) 1 dose  For those who have not received Prevnar 13 vaccine previously   Additional Screening, Testing, and Vaccinations may be recommended on an individualized basis based on family history, health history, risk factors, and/or exposure.  __________________________________________________________  Diet Recommendations for All Patients  I recommend that all patients maintain a diet low in saturated fats, carbohydrates, and cholesterol. While this can be challenging at first, it is not impossible and small changes can make big differences.  Things to try: Decreasing the amount of soda, sweet tea, and/or juice to one or less per day and replace with water While water is always the first choice, if you do not like water you may consider adding a water additive without sugar to improve the taste other sugar free drinks Replace potatoes with a brightly colored vegetable   Use healthy oils, such as canola oil or olive oil, instead of butter or hard margarine Limit your bread intake to two pieces or less a day Replace regular pasta with low carb pasta options Bake, broil, or grill foods instead of frying Monitor portion sizes  Eat smaller, more frequent meals throughout the day instead of large meals  An important thing to remember is, if you love foods that are not great for your health, you don't have to give them up completely. Instead, allow these foods to be a reward when you have done well. Allowing yourself to still have special treats every once in a while is a nice way to tell yourself thank you for working hard to keep yourself healthy.   Also remember that every day is a new day. If you have a bad day and "fall off the wagon", you can still climb right back up and keep moving along on your journey!  We have resources available to help you!  Some websites that may be helpful include: www.http://www.wall-moore.info/  Www.VeryWellFit.com _____________________________________________________________  Activity Recommendations for All Patients  I recommend that all adults  get at least 20 minutes of moderate physical activity that elevates your heart rate at least 5 days out of the week.  Some examples include: Walking or jogging at a pace that allows you to carry on a conversation Cycling (stationary bike or outdoors) Water aerobics Yoga Weight lifting Dancing If physical limitations prevent you from putting stress on your joints, exercise in a pool or seated in a chair are excellent options.  Do determine your MAXIMUM heart rate for activity: 220 - YOUR AGE = MAX Heart Rate   Remember! Do not push yourself too hard.  Start slowly and build up your pace, speed, weight, time in exercise, etc.  Allow your body to rest between exercise and get good sleep. You will need more water than normal when you are exerting yourself. Do not wait until you are thirsty to  drink. Drink with a purpose of getting in at least 8, 8 ounce glasses of water a day plus more depending on how much you exercise and sweat.    If you begin to develop dizziness, chest pain, abdominal pain, jaw pain, shortness of breath, headache, vision changes, lightheadedness, or other concerning symptoms, stop the activity and allow your body to rest. If your symptoms are severe, seek emergency evaluation immediately. If your symptoms are concerning, but not severe, please let us know so that we can recommend further evaluation.

## 2022-12-20 NOTE — Assessment & Plan Note (Signed)
Blood pressure is at goal for age and co-morbidities.   Recommendations: continue amlodipine and telmisartan - BP goal <130/80 - monitor and log blood pressures at home - check around the same time each day in a relaxed setting - Limit salt to <2000 mg/day - Follow DASH eating plan (heart healthy diet) - limit alcohol to 2 standard drinks per day for men and 1 per day for women - avoid tobacco products - get at least 2 hours of regular aerobic exercise weekly Patient aware of signs/symptoms requiring further/urgent evaluation. Labs updated today.

## 2022-12-20 NOTE — Assessment & Plan Note (Signed)
Previously well controlled Continue Synthroid at current dose  Recheck TSH and adjust Synthroid as indicated Continue following with endocrinology

## 2022-12-20 NOTE — Assessment & Plan Note (Signed)
Currently not consistent with supplementation. Checking labs today.  

## 2023-01-09 ENCOUNTER — Telehealth: Payer: Self-pay

## 2023-01-09 NOTE — Telephone Encounter (Signed)
Called patient to schedule new patient appointment. Left voicemail with our contact information to call back and schedule.  

## 2023-02-16 ENCOUNTER — Ambulatory Visit (AMBULATORY_SURGERY_CENTER): Payer: BC Managed Care – PPO

## 2023-02-16 ENCOUNTER — Encounter: Payer: Self-pay | Admitting: Internal Medicine

## 2023-02-16 VITALS — Ht <= 58 in | Wt 150.0 lb

## 2023-02-16 DIAGNOSIS — Z1211 Encounter for screening for malignant neoplasm of colon: Secondary | ICD-10-CM

## 2023-02-16 MED ORDER — NA SULFATE-K SULFATE-MG SULF 17.5-3.13-1.6 GM/177ML PO SOLN
1.0000 | Freq: Once | ORAL | 0 refills | Status: AC
Start: 2023-02-16 — End: 2023-02-16

## 2023-02-16 NOTE — Progress Notes (Signed)
No egg or soy allergy known to patient  No issues known to pt with past sedation with any surgeries or procedures Patient denies ever being told they had issues or difficulty with intubation  No FH of Malignant Hyperthermia Pt is not on diet pills Pt is not on  home 02  Pt is not on blood thinners  Pt denies issues with constipation  No A fib or A flutter Have any cardiac testing pending-- no  LOA: independent  Prep: suprep  Patient's chart reviewed by Cathlyn Parsons CNRA prior to previsit and patient appropriate for the [facility].  Previsit completed and red dot placed by patient's name on their procedure day (on provider's schedule).     PV competed with patient. Prep instructions sent via mychart and home address. Rx sent to [facility]. Goodrx coupon for [facility] provided to use for price reduction if needed.

## 2023-02-25 DIAGNOSIS — Z975 Presence of (intrauterine) contraceptive device: Secondary | ICD-10-CM | POA: Insufficient documentation

## 2023-03-03 ENCOUNTER — Encounter: Payer: Self-pay | Admitting: Internal Medicine

## 2023-03-03 ENCOUNTER — Ambulatory Visit (AMBULATORY_SURGERY_CENTER): Payer: BC Managed Care – PPO | Admitting: Internal Medicine

## 2023-03-03 VITALS — BP 131/72 | HR 65 | Temp 97.5°F | Resp 13 | Ht <= 58 in | Wt 150.0 lb

## 2023-03-03 DIAGNOSIS — D124 Benign neoplasm of descending colon: Secondary | ICD-10-CM

## 2023-03-03 DIAGNOSIS — Z1211 Encounter for screening for malignant neoplasm of colon: Secondary | ICD-10-CM | POA: Diagnosis present

## 2023-03-03 DIAGNOSIS — D128 Benign neoplasm of rectum: Secondary | ICD-10-CM

## 2023-03-03 DIAGNOSIS — D123 Benign neoplasm of transverse colon: Secondary | ICD-10-CM | POA: Diagnosis not present

## 2023-03-03 DIAGNOSIS — D122 Benign neoplasm of ascending colon: Secondary | ICD-10-CM | POA: Diagnosis not present

## 2023-03-03 MED ORDER — SODIUM CHLORIDE 0.9 % IV SOLN
500.0000 mL | Freq: Once | INTRAVENOUS | Status: DC
Start: 2023-03-03 — End: 2023-03-03

## 2023-03-03 NOTE — Patient Instructions (Signed)
Handout on polyps and hemorrhoids Await pathology results Resume previous diet and continue present medications Repeat colonoscopy for surveillance will be determined based off of pathology results   YOU HAD AN ENDOSCOPIC PROCEDURE TODAY AT THE [facility]:   Refer to the procedure report that was given to you for any specific questions about what was found during the examination.  If the procedure report does not answer your questions, please call your gastroenterologist to clarify.  If you requested that your care partner not be given the details of your procedure findings, then the procedure report has been included in a sealed envelope for you to review at your convenience later.  YOU SHOULD EXPECT: Some feelings of bloating in the abdomen. Passage of more gas than usual.  Walking can help get rid of the air that was put into your GI tract during the procedure and reduce the bloating. If you had a lower endoscopy (such as a colonoscopy or flexible sigmoidoscopy) you may notice spotting of blood in your stool or on the toilet paper. If you underwent a bowel prep for your procedure, you may not have a normal bowel movement for a few days.  Please Note:  You might notice some irritation and congestion in your nose or some drainage.  This is from the oxygen used during your procedure.  There is no need for concern and it should clear up in a day or so.  SYMPTOMS TO REPORT IMMEDIATELY:  Following lower endoscopy (colonoscopy or flexible sigmoidoscopy):  Excessive amounts of blood in the stool  Significant tenderness or worsening of abdominal pains  Swelling of the abdomen that is new, acute  Fever of 100F or higher  For urgent or emergent issues, a gastroenterologist can be reached at any hour by calling [phone]. Do not use MyChart messaging for urgent concerns.    DIET:  We do recommend a small meal at first, but then you may proceed to your regular diet.  Drink plenty of  fluids but you should avoid alcoholic beverages for 24 hours.  ACTIVITY:  You should plan to take it easy for the rest of today and you should NOT DRIVE or use heavy machinery until tomorrow (because of the sedation medicines used during the test).    FOLLOW UP: Our staff will call the number listed on your records the next business day following your procedure.  We will call around 7:15- 8:00 am to check on you and address any questions or concerns that you may have regarding the information given to you following your procedure. If we do not reach you, we will leave a message.     If any biopsies were taken you will be contacted by phone or by letter within the next 1-3 weeks.  Please call us at [phone] if you have not heard about the biopsies in 3 weeks.    SIGNATURES/CONFIDENTIALITY: You and/or your care partner have signed paperwork which will be entered into your electronic medical record.  These signatures attest to the fact that that the information above on your After Visit Summary has been reviewed and is understood.  Full responsibility of the confidentiality of this discharge information lies with you and/or your care-partner.

## 2023-03-03 NOTE — Progress Notes (Signed)
A and O x3. Report to RN. Tolerated MAC anesthesia well. 

## 2023-03-03 NOTE — Op Note (Signed)
[facility] Patient Name: Rebekah Knox Procedure Date: 03/03/2023 7:45 AM MRN: [id number] Endoscopist: Particia Lather , , [id number] Age: 52 Referring MD:  Date of Birth: 09-May-1971 Gender: Female Account #: [id number] Procedure:                Colonoscopy Indications:              Screening for colorectal malignant neoplasm, This                            is the patient's first colonoscopy Medicines:                Monitored Anesthesia Care Procedure:                Pre-Anesthesia Assessment:                           - Prior to the procedure, a History and Physical                            was performed, and patient medications and                            allergies were reviewed. The patient's tolerance of                            previous anesthesia was also reviewed. The risks                            and benefits of the procedure and the sedation                            options and risks were discussed with the patient.                            All questions were answered, and informed consent                            was obtained. Prior Anticoagulants: The patient has                            taken no anticoagulant or antiplatelet agents. ASA                            Grade Assessment: II - A patient with mild systemic                            disease. After reviewing the risks and benefits,                            the patient was deemed in satisfactory condition to                            undergo the procedure.  After obtaining informed consent, the colonoscope                            was passed under direct vision. Throughout the                            procedure, the patient's blood pressure, pulse, and                            oxygen saturations were monitored continuously. The                            Olympus Scope SN: [id number] was introduced through                            the anus and  advanced to the the terminal ileum.                            The colonoscopy was performed without difficulty.                            The patient tolerated the procedure well. The                            quality of the bowel preparation was excellent. The                            terminal ileum, ileocecal valve, appendiceal                            orifice, and rectum were photographed. Scope In: 8:11:00 AM Scope Out: 8:29:27 AM Scope Withdrawal Time: 0 hours 15 minutes 13 seconds  Total Procedure Duration: 0 hours 18 minutes 27 seconds  Findings:                 The terminal ileum appeared normal.                           Six sessile polyps were found in the transverse                            colon and ascending colon. The polyps were 3 to 6                            mm in size. These polyps were removed with a cold                            snare. Resection and retrieval were complete.                           Two sessile polyps were found in the rectum and                            descending colon. The  polyps were 4 to 6 mm in                            size. These polyps were removed with a cold snare.                            Resection and retrieval were complete.                           Non-bleeding internal hemorrhoids were found during                            retroflexion. Complications:            No immediate complications. Estimated Blood Loss:     Estimated blood loss was minimal. Impression:               - The examined portion of the ileum was normal.                           - Six 3 to 6 mm polyps in the transverse colon and                            in the ascending colon, removed with a cold snare.                            Resected and retrieved.                           - Two 4 to 6 mm polyps in the rectum and in the                            descending colon, removed with a cold snare.                            Resected and  retrieved.                           - Non-bleeding internal hemorrhoids. Recommendation:           - Discharge patient to home (with escort).                           - Await pathology results.                           - The findings and recommendations were discussed                            with the patient. Dr Particia Lather "Alan Ripper" Leonides Schanz,  03/03/2023 8:35:20 AM

## 2023-03-03 NOTE — Progress Notes (Signed)
Called to room to assist during endoscopic procedure.  Patient ID and intended procedure confirmed with present staff. Received instructions for my participation in the procedure from the performing physician.  

## 2023-03-03 NOTE — Progress Notes (Signed)
Pt's states no medical or surgical changes since previsit or office visit. 

## 2023-03-03 NOTE — Progress Notes (Signed)
GASTROENTEROLOGY PROCEDURE H&P NOTE   Primary Care Physician: Clayborne Dana, NP    Reason for Procedure:   Colon cancer screening  Plan:    Colonoscopy   Patient is appropriate for endoscopic procedure(s) in the ambulatory ([facility]) setting.  The nature of the procedure, as well as the risks, benefits, and alternatives were carefully and thoroughly reviewed with the patient. Ample time for discussion and questions allowed. The patient understood, was satisfied, and agreed to proceed.     HPI: Rebekah Knox is a 52 y.o. female who presents for colonoscopy for colon cancer screening. Denies blood in stools, changes in bowel habits, or unintentional weight loss. Denies family history of colon cancer.  Past Medical History:  Diagnosis Date   Dysmenorrhea    Hyperlipidemia    Hypertension    Thyroid disease     Past Surgical History:  Procedure Laterality Date   EXCISION METACARPAL MASS Right 08/06/2018   Procedure: EXCISION OF INDEX FINGER MASS;  Surgeon: Mack Hook, MD;  Location: [facility];  Service: Orthopedics;  Laterality: Right;  LENGTH: 30 MINS   WISDOM TOOTH EXTRACTION      Prior to Admission medications   Medication Sig Start Date End Date Taking? Authorizing Provider  amLODipine (NORVASC) 5 MG tablet Take 1 tablet (5 mg total) by mouth daily. 12/20/22  Yes Clayborne Dana, NP  Cholecalciferol (VITAMIN D3) 50 MCG (2000 UT) capsule Take 2,000 Units by mouth daily.   Yes [provider]  levothyroxine (SYNTHROID) 100 MCG tablet Take 1 tablet (100 mcg total) by mouth daily. 02/27/19  Yes Saguier, Ramon Dredge, PA-C  Multiple Vitamins-Minerals (WOMENS MULTIVITAMIN PO) Take by mouth daily.   Yes [provider]  simvastatin (ZOCOR) 20 MG tablet Take 1 tablet (20 mg total) by mouth at bedtime. 12/20/22  Yes Clayborne Dana, NP  telmisartan (MICARDIS) 40 MG tablet Take 1 tablet (40 mg total) by mouth daily. 12/20/22  Yes Clayborne Dana, NP   cyanocobalamin (VITAMIN B12) 1000 MCG tablet Take 1,000 mcg by mouth daily. Patient not taking: Reported on 02/16/2023    [provider]    Current Outpatient Medications  Medication Sig Dispense Refill   amLODipine (NORVASC) 5 MG tablet Take 1 tablet (5 mg total) by mouth daily. 90 tablet 0   Cholecalciferol (VITAMIN D3) 50 MCG (2000 UT) capsule Take 2,000 Units by mouth daily.     levothyroxine (SYNTHROID) 100 MCG tablet Take 1 tablet (100 mcg total) by mouth daily. 90 tablet 3   Multiple Vitamins-Minerals (WOMENS MULTIVITAMIN PO) Take by mouth daily.     simvastatin (ZOCOR) 20 MG tablet Take 1 tablet (20 mg total) by mouth at bedtime. 90 tablet 3   telmisartan (MICARDIS) 40 MG tablet Take 1 tablet (40 mg total) by mouth daily. 90 tablet 0   cyanocobalamin (VITAMIN B12) 1000 MCG tablet Take 1,000 mcg by mouth daily. (Patient not taking: Reported on 02/16/2023)     Current Facility-Administered Medications  Medication Dose Route Frequency Provider Last Rate Last Admin   0.9 %  sodium chloride infusion  500 mL Intravenous Once Imogene Burn, MD        Allergies as of 03/03/2023   (No Known Allergies)    Family History  Problem Relation Age of Onset   Cancer Mother        lung   Heart attack Father    Diabetes Father    Hyperlipidemia Father    Colon cancer Neg Hx  Colon polyps Neg Hx    Esophageal cancer Neg Hx    Rectal cancer Neg Hx    Stomach cancer Neg Hx     Social History   Socioeconomic History   Marital status: Married    Spouse name: Not on file   Number of children: Not on file   Years of education: Not on file   Highest education level: Not on file  Occupational History   Not on file  Tobacco Use   Smoking status: Never   Smokeless tobacco: Never  Vaping Use   Vaping status: Never Used  Substance and Sexual Activity   Alcohol use: No    Alcohol/week: 0.0 standard drinks of alcohol   Drug use: No   Sexual activity: Yes    Partners: Male     Birth control/protection: I.U.D.  Other Topics Concern   Not on file  Social History Narrative   Married since 1995   Lives in house, two stories, 6 persons live in home, no pets   No exercise    Social Determinants of Health   Financial Resource Strain: Not on file  Food Insecurity: Not on file  Transportation Needs: Not on file  Physical Activity: Unknown (07/20/2021)   Received from [facility] visits prior to 10/22/2022., [facility] visits prior to 10/22/2022.   Exercise Vital Sign    Days of Exercise per Week: 7 days    Minutes of Exercise per Session: Not on file  Stress: Stress Concern Present (07/20/2021)   Received from [facility] visits prior to 10/22/2022., [facility] visits prior to 10/22/2022.   [employer] of Occupational Health - Occupational Stress Questionnaire    Feeling of Stress : To some extent  Social Connections: Unknown (07/20/2021)   Received from [facility] visits prior to 10/22/2022., [facility] visits prior to 10/22/2022.   Social Advertising account executive [NHANES]    Frequency of Communication with Friends and Family: More than three times a week    Frequency of Social Gatherings with Friends and Family: More than three times a week    Attends Religious Services: More than 4 times per year    Active Member of [employer] or Organizations: Not on file    Attends Banker Meetings: Not on file    Marital Status: Not on file  Intimate Partner Violence: Not on file    Physical Exam: Vital signs in last 24 hours: BP 115/71   Pulse 77   Temp (!) 97.5 F (36.4 C)   Ht 4\' 10"  (1.473 m)   Wt 150 lb (68 kg)   SpO2 97%   BMI 31.35 kg/m  GEN: NAD EYE: Sclerae anicteric ENT: MMM CV: Non-tachycardic Pulm: No increased work of breathing GI: Soft, NT/ND NEURO:  Alert & Oriented   Eulah Pont, MD [facility]  [facility]  03/03/2023 8:07 AM

## 2023-03-06 ENCOUNTER — Telehealth: Payer: Self-pay

## 2023-03-06 NOTE — Telephone Encounter (Signed)
  Follow up Call-     03/03/2023    7:30 AM  Call back number  Post procedure Call Back phone  # [phone]  Permission to leave phone message Yes     Patient questions:  Do you have a fever, pain , or abdominal swelling? No. Pain Score  0 *  Have you tolerated food without any problems? Yes.    Have you been able to return to your normal activities? Yes.    Do you have any questions about your discharge instructions: Diet   No. Medications  No. Follow up visit  No.  Do you have questions or concerns about your Care? No.  Actions: * If pain score is 4 or above: No action needed, pain <4.

## 2023-03-07 ENCOUNTER — Encounter: Payer: Self-pay | Admitting: Internal Medicine

## 2023-04-07 ENCOUNTER — Encounter: Payer: BC Managed Care – PPO | Admitting: Obstetrics and Gynecology

## 2023-06-21 ENCOUNTER — Encounter: Payer: Self-pay | Admitting: Family Medicine

## 2023-06-21 ENCOUNTER — Ambulatory Visit: Payer: BC Managed Care – PPO | Admitting: Family Medicine

## 2023-06-21 VITALS — BP 128/84 | HR 76 | Ht <= 58 in | Wt 165.0 lb

## 2023-06-21 DIAGNOSIS — E538 Deficiency of other specified B group vitamins: Secondary | ICD-10-CM | POA: Diagnosis not present

## 2023-06-21 DIAGNOSIS — E66811 Obesity, class 1: Secondary | ICD-10-CM | POA: Diagnosis not present

## 2023-06-21 DIAGNOSIS — Z23 Encounter for immunization: Secondary | ICD-10-CM

## 2023-06-21 DIAGNOSIS — R5383 Other fatigue: Secondary | ICD-10-CM

## 2023-06-21 DIAGNOSIS — M79645 Pain in left finger(s): Secondary | ICD-10-CM

## 2023-06-21 DIAGNOSIS — E785 Hyperlipidemia, unspecified: Secondary | ICD-10-CM | POA: Diagnosis not present

## 2023-06-21 DIAGNOSIS — E559 Vitamin D deficiency, unspecified: Secondary | ICD-10-CM

## 2023-06-21 DIAGNOSIS — M25561 Pain in right knee: Secondary | ICD-10-CM

## 2023-06-21 DIAGNOSIS — R7303 Prediabetes: Secondary | ICD-10-CM

## 2023-06-21 DIAGNOSIS — E039 Hypothyroidism, unspecified: Secondary | ICD-10-CM

## 2023-06-21 DIAGNOSIS — I10 Essential (primary) hypertension: Secondary | ICD-10-CM

## 2023-06-21 LAB — COMPREHENSIVE METABOLIC PANEL
ALT: 26 U/L (ref 0–35)
AST: 17 U/L (ref 0–37)
Albumin: 4.1 g/dL (ref 3.5–5.2)
Alkaline Phosphatase: 72 U/L (ref 39–117)
BUN: 8 mg/dL (ref 6–23)
CO2: 26 meq/L (ref 19–32)
Calcium: 9.2 mg/dL (ref 8.4–10.5)
Chloride: 103 meq/L (ref 96–112)
Creatinine, Ser: 0.65 mg/dL (ref 0.40–1.20)
GFR: 102.69 mL/min (ref >60.00)
Glucose, Bld: 103 mg/dL — ABNORMAL HIGH (ref 70–99)
Potassium: 4.3 meq/L (ref 3.5–5.1)
Sodium: 137 meq/L (ref 135–145)
Total Bilirubin: 0.4 mg/dL (ref 0.2–1.2)
Total Protein: 7.1 g/dL (ref 6.0–8.3)

## 2023-06-21 LAB — CBC WITH DIFFERENTIAL/PLATELET
Basophils Absolute: 0 10*3/uL (ref 0.0–0.1)
Basophils Relative: 0.4 % (ref 0.0–3.0)
Eosinophils Absolute: 0 10*3/uL (ref 0.0–0.7)
Eosinophils Relative: 0.5 % (ref 0.0–5.0)
HCT: 41.6 % (ref 36.0–46.0)
Hemoglobin: 13.6 g/dL (ref 12.0–15.0)
Lymphocytes Relative: 34.1 % (ref 12.0–46.0)
Lymphs Abs: 1.9 10*3/uL (ref 0.7–4.0)
MCHC: 32.7 g/dL (ref 30.0–36.0)
MCV: 86.3 fL (ref 78.0–100.0)
Monocytes Absolute: 0.5 10*3/uL (ref 0.1–1.0)
Monocytes Relative: 8.5 % (ref 3.0–12.0)
Neutro Abs: 3.2 10*3/uL (ref 1.4–7.7)
Neutrophils Relative %: 56.5 % (ref 43.0–77.0)
Platelets: 311 10*3/uL (ref 150.0–400.0)
RBC: 4.82 Mil/uL (ref 3.87–5.11)
RDW: 13.2 % (ref 11.5–15.5)
WBC: 5.6 10*3/uL (ref 4.0–10.5)

## 2023-06-21 LAB — IBC + FERRITIN
Ferritin: 35.5 ng/mL (ref 10.0–291.0)
Iron: 93 ug/dL (ref 42–145)
Saturation Ratios: 23.5 % (ref 20.0–50.0)
TIBC: 396.2 ug/dL (ref 250.0–450.0)
Transferrin: 283 mg/dL (ref 212.0–360.0)

## 2023-06-21 LAB — LIPID PANEL
Cholesterol: 167 mg/dL (ref 0–200)
HDL: 48.6 mg/dL (ref >39.00)
LDL Cholesterol: 93 mg/dL (ref 0–99)
NonHDL: 118.34
Total CHOL/HDL Ratio: 3
Triglycerides: 128 mg/dL (ref 0.0–149.0)
VLDL: 25.6 mg/dL (ref 0.0–40.0)

## 2023-06-21 LAB — B12 AND FOLATE PANEL
Folate: 19.2 ng/mL (ref >5.9)
Vitamin B-12: 728 pg/mL (ref 211–911)

## 2023-06-21 LAB — TSH: TSH: 2 u[IU]/mL (ref 0.35–5.50)

## 2023-06-21 LAB — VITAMIN D 25 HYDROXY (VIT D DEFICIENCY, FRACTURES): VITD: 35.55 ng/mL (ref 30.00–100.00)

## 2023-06-21 LAB — HEMOGLOBIN A1C: Hgb A1c MFr Bld: 6.6 % — ABNORMAL HIGH (ref 4.6–6.5)

## 2023-06-21 MED ORDER — DICLOFENAC SODIUM 1 % EX GEL: 2.0000 g | Freq: Four times a day (QID) | CUTANEOUS | 5 refills | Status: DC

## 2023-06-21 NOTE — Assessment & Plan Note (Addendum)
Monitor weight and dietary intake. Exercise at least 150 minutes weekly and ensure adequate hydration with water. Avoid sugary, high calorie drinks and foods.

## 2023-06-21 NOTE — Patient Instructions (Addendum)
Labs today  Adding Voltaren gel for you knee and finger pain Ice, stretches, massage If not improving we can get an xray

## 2023-06-21 NOTE — Assessment & Plan Note (Addendum)
Labs today Encourage low-carb diet and regular exercise

## 2023-06-21 NOTE — Progress Notes (Signed)
Established Patient Office Visit  Subjective    Patient ID: Rebekah Knox, female    DOB: June 15, 1971  Age: 52 y.o. MRN: [id number]  CC:  Chief Complaint  Patient presents with   Medical Management of Chronic Issues    HPI Rebekah Knox presents for follow-up and chronic disease management.   Discussed the use of AI scribe software for clinical note transcription with the patient, who gave verbal consent to proceed.  History of Present Illness   The patient, with a history of hypertension, thyroid disease, and hyperlipidemia, presents for a six-month follow-up visit. She reports adherence to her prescribed medications, including amlodipine, levothyroxine, and simvastatin. However, she has not been regularly monitoring her blood pressure at home. She denies experiencing any headaches, chest pain, or vision changes, but reports feeling lethargic and "lazy" over the past week. She is unsure if this is related to low blood pressure.  The patient also reports recent musculoskeletal pain, particularly in her left 5th finger and right medial knee, which she attributes to dancing at a recent party. She denies any significant swelling but has sought treatment from a chiropractor, including laser therapy, which has provided some relief. She denies any known injury to the finger and reports that over-the-counter Aleve has not been helpful in managing the pain.  In terms of lifestyle, the patient maintains a strict vegetarian diet and takes vitamin D and B12 supplements. She reports some recent weight gain, which she attributes to a busy travel schedule and attending numerous social events over the past six months. She has recently resumed walking for exercise, aiming for 20 minutes daily. She also reports some recent stress related to managing social events and expresses a desire to return to a more regular routine.  The patient has an intrauterine device (IUD) in place and reports weight gain  since its insertion. She also reports experiencing hot flashes and general nervousness over the past two weeks, which she finds unusual as she is typically quite active. She sees OBGYN in December. She denies any changes to her hair, skin, or nails, and reports sleeping seven to eight hours per night. She denies any gastrointestinal issues and reports no changes in her cholesterol medication. She does not smoke.        Hypertension: - Medications: amlodipine 5 mg daily, telmisartan 40 mg daily - Compliance: good - Checking BP at home: no - Denies any SOB, recurrent headaches, CP, vision changes, LE edema, dizziness, palpitations, or medication side effects. - Diet: vegetarian - Exercise: walking, active at work  Vitamin D Deficiency: - 2,000 units every 3 days - borderline low labs at last check in April  B12 Deficiency: - 2,000 mcg every 3 days usually, but has not taken in the past 2 weeks - Recommended dosage decrease after high levels in April  Hypothyroidism: - Management: levothyroxine 100 mcg daily -Taking medications as prescribed in the morning, apart from other foods, meds, vitamins, etc.  -No recent changes to hair, skin, nails. Reports she has been feeling fatigue and "off" lately. She has noticed some weight gain since last visit. States she has started having some hot flashes (she has IUD in place since 2023) - she had a referral to GYN, but did not follow-up. - She is following with Dr. Allena Katz at [facility]  Hyperlipidemia: - medications: simvastatin 20 mg daily  - compliance: good - medication SEs: none The 10-year ASCVD risk score (Arnett DK, et al., 2019) is: 1.4%  Values used to calculate the score:     Age: 52 years     Sex: Female     Is Non-Hispanic African American: No     Diabetic: No     Tobacco smoker: No     Systolic Blood Pressure: 128 mmHg     Is BP treated: Yes     HDL Cholesterol: 46.8 mg/dL     Total Cholesterol: 148  mg/dL     Wt Readings from Last 3 Encounters:  06/21/23 165 lb (74.8 kg)  03/03/23 150 lb (68 kg)  02/16/23 150 lb (68 kg)     ROS All review of systems negative except what is listed in the HPI      Objective    BP 128/84   Pulse 76   Ht 4\' 10"  (1.473 m)   Wt 165 lb (74.8 kg)   SpO2 99%   BMI 34.49 kg/m   Physical Exam Vitals reviewed.  Constitutional:      Appearance: Normal appearance.  Cardiovascular:     Rate and Rhythm: Normal rate and regular rhythm.     Pulses: Normal pulses.     Heart sounds: Normal heart sounds.  Pulmonary:     Effort: Pulmonary effort is normal.     Breath sounds: Normal breath sounds.  Musculoskeletal:     Cervical back: Normal range of motion and neck supple. No tenderness.     Right lower leg: No edema.     Left lower leg: No edema.  Lymphadenopathy:     Cervical: No cervical adenopathy.  Skin:    General: Skin is warm and dry.  Neurological:     Mental Status: She is alert and oriented to person, place, and time.  Psychiatric:        Mood and Affect: Mood normal.        Behavior: Behavior normal.        Thought Content: Thought content normal.        Judgment: Judgment normal.         Assessment & Plan:   Problem List Items Addressed This Visit       Active Problems   Hypothyroidism - Primary (Chronic)    Previously well controlled Continue Synthroid at current dose  Recheck TSH and adjust Synthroid as indicated Continue following with endocrinology      Relevant Orders   TSH   Hyperlipidemia (Chronic)    Medication management: continue simvastatin Lifestyle factors for lowering cholesterol include: Diet therapy - heart-healthy diet rich in fruits, veggies, fiber-rich whole grains, lean meats, chicken, fish (at least twice a week), fat-free or 1% dairy products; foods low in saturated/trans fats, cholesterol, sodium, and sugar. Mediterranean diet has shown to be very heart healthy. Regular exercise -  recommend at least 30 minutes a day, 5 times per week Weight management  Repeat CMP and lipid panel today      Relevant Orders   Lipid panel   B12 deficiency (Chronic)    Currently on supplement, but not consistent Labs today       Relevant Orders   B12 and Folate Panel   Essential hypertension (Chronic)    Blood pressure is at goal for age and co-morbidities.   Recommendations: Continue current medications. - monitor and log blood pressures at home - check around the same time each day in a relaxed setting - Limit salt to <2000 mg/day - Follow DASH eating plan (heart healthy diet) - limit alcohol to 2 standard drinks  per day for men and 1 per day for women - avoid tobacco products - get at least 2 hours of regular aerobic exercise weekly Patient aware of signs/symptoms requiring further/urgent evaluation. Labs updated today.       Vitamin D deficiency (Chronic)    Continue taking vitamin D supplement daily. Adjustment pending labs.      Relevant Orders   VITAMIN D 25 Hydroxy (Vit-D Deficiency, Fractures)   Prediabetes    Labs today Encourage low-carb diet and regular exercise      Relevant Orders   Hemoglobin A1c   Obesity (BMI 30.0-34.9)    Monitor weight and dietary intake. Exercise at least 150 minutes weekly and ensure adequate hydration with water. Avoid sugary, high calorie drinks and foods.      Relevant Orders   CBC with Differential/Platelet   Comprehensive metabolic panel   Hemoglobin A1c   Lipid panel   TSH   Other Visit Diagnoses     Flu vaccine need       Relevant Orders   Flu vaccine trivalent PF, 6mos and older(Flulaval,Afluria,Fluarix,Fluzone) (Completed)   Fatigue, unspecified type    Update labs today. Likely related to recent hectic schedule and inconsistency with healthy lifestyle measures an adequate sleep.    Relevant Orders   CBC with Differential/Platelet   Comprehensive metabolic panel   Z61 and Folate Panel   IBC + Ferritin    TSH   Acute pain of right knee    Adding voltaren gel Ice, stretches, massage, NSAIDs If not improving we can get an xray   Relevant Medications   diclofenac Sodium (VOLTAREN ARTHRITIS PAIN) 1 % GEL   Pain of finger of left hand     Adding voltaren gel Ice, stretches, massage, NSAIDs If not improving we can get an xray   Relevant Medications   diclofenac Sodium (VOLTAREN ARTHRITIS PAIN) 1 % GEL        Return in about 6 months (around 12/20/2023) for routine follow-up.   Clayborne Dana, NP

## 2023-06-21 NOTE — Assessment & Plan Note (Addendum)
Currently on supplement, but not consistent Labs today

## 2023-06-21 NOTE — Assessment & Plan Note (Addendum)
Medication management: continue simvastatin Lifestyle factors for lowering cholesterol include: Diet therapy - heart-healthy diet rich in fruits, veggies, fiber-rich whole grains, lean meats, chicken, fish (at least twice a week), fat-free or 1% dairy products; foods low in saturated/trans fats, cholesterol, sodium, and sugar. Mediterranean diet has shown to be very heart healthy. Regular exercise - recommend at least 30 minutes a day, 5 times per week Weight management  Repeat CMP and lipid panel today

## 2023-06-21 NOTE — Assessment & Plan Note (Signed)
Blood pressure is at goal for age and co-morbidities.   Recommendations: Continue current medications. - monitor and log blood pressures at home - check around the same time each day in a relaxed setting - Limit salt to <2000 mg/day - Follow DASH eating plan (heart healthy diet) - limit alcohol to 2 standard drinks per day for men and 1 per day for women - avoid tobacco products - get at least 2 hours of regular aerobic exercise weekly Patient aware of signs/symptoms requiring further/urgent evaluation. Labs updated today.

## 2023-06-21 NOTE — Assessment & Plan Note (Addendum)
Previously well controlled Continue Synthroid at current dose  Recheck TSH and adjust Synthroid as indicated Continue following with endocrinology

## 2023-06-21 NOTE — Progress Notes (Signed)
Established Patient Office Visit  Subjective   Patient ID: Rebekah Knox, female    DOB: June 09, 1971  Age: 52 y.o. MRN: [id number]  Patient presents today for 6 month follow-up visit. Patient reports increased anxiety and nervousness lately due to increased family functions and social events. Patient reports increased weight gain, right knee pain, pain in left pinky, and feeling fatigued and having low energy, but states that it does not affect her daily routine or function. Patient reports no other concerns at this time and is in agreement with checking labs to determine if thyroid regimen needs to be adjusted. Patient has appointment with Endocrinologist for December 2024.  HPI  Hypertension: - Medications: yes - Compliance: yes - Checking BP at home: no  - Denies any SOB, recurrent headaches, CP, vision changes, LE edema, dizziness, palpitations, or medication side effects. - Diet: strict vegetarian - Exercise: walking 20 mins daily, started 2 days ago - Stressors: busy with social events, family engagements, and intermittent traveling for the past months.  - Patient states she was previously taking 2000 mg vitamin B12 supplement once every week; states she has not taken it in the past 15 days but has restarted taking the supplement yesterday.  -Patient reports taking vitamin D supplement of 2000 international units every 3 days.   HYPOTHYROIDISM Current Medication and Dosages: Thyroid control status:stable Satisfied with current treatment? yes Medication side effects: no 15 lbs since July 2024 Medication compliance: good compliance Etiology of hypothyroidism:  Recent dose adjustment:no Fatigue: yes Cold intolerance: no Heat intolerance: yes; reports hot flashes. LMP 3 months ago, now spotting. Currently has IUD in place. Weight gain: yes, 15 lbs since July 2024 Weight loss: no Constipation: no Diarrhea/loose stools: no Palpitations: no Lower extremity edema:  no Anxiety/depressed mood: no depression, reports she has been more anxious lately.  Hyperlipidemia:  Patient reports good compliance with simvastatin; states she takes nightly. Denies muscle pain, redness, or sign/sxs of rhabdo.  ROS  All systems normal except for what is specified above.     Objective:    Physical Exam Constitutional:      Appearance: Normal appearance. She is obese.  HENT:     Head: Normocephalic.     Right Ear: Tympanic membrane, ear canal and external ear normal.     Left Ear: Tympanic membrane, ear canal and external ear normal.     Nose: Rhinorrhea present.     Mouth/Throat:     Mouth: Mucous membranes are moist.     Pharynx: Oropharynx is clear.  Eyes:     Extraocular Movements: Extraocular movements intact.     Conjunctiva/sclera: Conjunctivae normal.     Pupils: Pupils are equal, round, and reactive to light.  Cardiovascular:     Rate and Rhythm: Normal rate.     Pulses: Normal pulses.     Heart sounds: Normal heart sounds.  Pulmonary:     Effort: Pulmonary effort is normal.     Breath sounds: Normal breath sounds.  Musculoskeletal:        General: Normal range of motion.     Cervical back: Normal range of motion and neck supple.  Skin:    General: Skin is warm and dry.  Neurological:     General: No focal deficit present.     Mental Status: She is alert and oriented to person, place, and time. Mental status is at baseline.  Psychiatric:        Mood and Affect: Mood normal.  Behavior: Behavior normal.        Thought Content: Thought content normal.        Judgment: Judgment normal.        Assessment & Plan:   Problem List Items Addressed This Visit       Cardiovascular and Mediastinum   Essential hypertension (Chronic)    Blood pressure is at goal for age and co-morbidities.   Recommendations: Continue current medications. - monitor and log blood pressures at home - check around the same time each day in a relaxed  setting - Limit salt to <2000 mg/day - Follow DASH eating plan (heart healthy diet) - limit alcohol to 2 standard drinks per day for men and 1 per day for women - avoid tobacco products - get at least 2 hours of regular aerobic exercise weekly Patient aware of signs/symptoms requiring further/urgent evaluation. Labs updated today.         Endocrine   Hypothyroidism - Primary (Chronic)    Continue current levothyroxine dosage until labs have results. Keep appointment with Endo in December for f/u. Monitor symptoms such as weight gain, mood, and energy levels.      Relevant Orders   TSH     Other   Hyperlipidemia (Chronic)    Continue taking simvastatin nightly and eating a low-fat diet. Monitor for side effects of medication.       Relevant Orders   Lipid panel   B12 deficiency (Chronic)    Continue taking vitamin B12 supplement every week. Dosage adjustment pending lab result.      Relevant Orders   B12 and Folate Panel   Vitamin D deficiency (Chronic)    Continue taking vitamin D supplement daily. Adjustment pending labs.      Relevant Orders   VITAMIN D 25 Hydroxy (Vit-D Deficiency, Fractures)   Prediabetes    Monitor carb and sugar intake. Try to stick to a heart healthy diet and to exercise at least 150 minutes every week doing moderate intensity exercises.       Relevant Orders   Hemoglobin A1c   Obesity (BMI 30.0-34.9)    Monitor weight and dietary intake. Exercise at least 150 minutes weekly and ensure adequate hydration with water. Avoid sugary, high calorie drinks and foods.      Relevant Orders   CBC with Differential/Platelet   Comprehensive metabolic panel   Hemoglobin A1c   Lipid panel   TSH   Other Visit Diagnoses     Flu vaccine need       Relevant Orders   Flu vaccine trivalent PF, 6mos and older(Flulaval,Afluria,Fluarix,Fluzone) (Completed)   Fatigue, unspecified type       Relevant Orders   CBC with Differential/Platelet   Comprehensive  metabolic panel   Z61 and Folate Panel   IBC + Ferritin   TSH   Acute pain of right knee       -recommended ice, heat, rest, stretches and topical NSAID. If no improvement in 1 month, return to office for potential Xray. Relevant Medications   diclofenac Sodium (VOLTAREN ARTHRITIS PAIN) 1 % GEL   Pain of finger of left hand       recommended ice, heat, and topical NSAID. If no improvement in 1 month, return to office for potential Xray. Relevant Medications   diclofenac Sodium (VOLTAREN ARTHRITIS PAIN) 1 % GEL       Return in about 6 months (around 12/20/2023) for routine follow-up.    Luisa Hart, RN

## 2023-06-21 NOTE — Assessment & Plan Note (Signed)
Continue taking vitamin D supplement daily. Adjustment pending labs.

## 2023-06-23 ENCOUNTER — Encounter: Payer: Self-pay | Admitting: Neurology

## 2023-08-11 ENCOUNTER — Encounter: Payer: BC Managed Care – PPO | Admitting: Obstetrics and Gynecology

## 2023-08-22 ENCOUNTER — Other Ambulatory Visit (HOSPITAL_COMMUNITY)
Admission: RE | Admit: 2023-08-22 | Discharge: 2023-08-22 | Disposition: A | Discharge status: Home / Self Care | Payer: BC Managed Care – PPO | Source: Ambulatory Visit | Attending: Family Medicine | Admitting: Family Medicine

## 2023-08-22 ENCOUNTER — Ambulatory Visit (INDEPENDENT_AMBULATORY_CARE_PROVIDER_SITE_OTHER): Payer: BC Managed Care – PPO | Admitting: Family Medicine

## 2023-08-22 ENCOUNTER — Encounter: Payer: Self-pay | Admitting: Family Medicine

## 2023-08-22 VITALS — BP 126/80 | HR 72 | Ht 60.0 in | Wt 157.0 lb

## 2023-08-22 DIAGNOSIS — Z975 Presence of (intrauterine) contraceptive device: Secondary | ICD-10-CM

## 2023-08-22 DIAGNOSIS — Z1339 Encounter for screening examination for other mental health and behavioral disorders: Secondary | ICD-10-CM

## 2023-08-22 DIAGNOSIS — Z01419 Encounter for gynecological examination (general) (routine) without abnormal findings: Secondary | ICD-10-CM

## 2023-08-22 NOTE — Progress Notes (Signed)
   GYNECOLOGY ANNUAL PREVENTATIVE CARE ENCOUNTER NOTE  Subjective:   Rebekah Knox is a 52 y.o. G31P2002 female here for a routine annual gynecologic exam.    Current complaints: spotting once a month.  Reports itching, then spotting and then itching resolves after spotting ends. Had heavy cycles, got IUD and the kept in until 49, removed but periods worsened. She had a new IUD placed at [facility] 02/25/2023.    Denies abnormal vaginal bleeding, discharge, pelvic pain, problems with intercourse or other gynecologic concerns.    Gynecologic History No LMP recorded. (Menstrual status: IUD). Contraception: IUD- placed within past year.  Last Pap: Today.  Last mammogram: April 2024. Results were: normal  Health Maintenance Due  Topic Date Due   Hepatitis C Screening  Never done   Zoster Vaccines- Shingrix  (1 of 2) Never done    The following portions of the patient's history were reviewed and updated as appropriate: allergies, current medications, past family history, past medical history, past social history, past surgical history and problem list.  Review of Systems Pertinent items are noted in HPI.   Objective:  BP 126/80 (BP Location: Left Arm, Patient Position: Sitting, Cuff Size: Large)   Pulse 72   Ht 5' (1.524 m)   Wt 157 lb (71.2 kg)   BMI 30.66 kg/m  CONSTITUTIONAL: Well-developed, well-nourished female in no acute distress.  HENT:  Normocephalic, atraumatic, External right and left ear normal. Oropharynx is clear and moist EYES:  No scleral icterus.  NECK: Normal range of motion, supple, no masses.  Normal thyroid .  SKIN: Skin is warm and dry. No rash noted. Not diaphoretic. No erythema. No pallor. NEUROLOGIC: Alert and oriented to person, place, and time. Normal reflexes, muscle tone coordination. No cranial nerve deficit noted. PSYCHIATRIC: Normal mood and affect. Normal behavior. Normal judgment and thought content. CARDIOVASCULAR: Normal heart rate noted, regular  rhythm. 2+ distal pulses. RESPIRATORY: Effort and breath sounds normal, no problems with respiration noted. BREASTS: Symmetric in size. No masses, skin changes, nipple drainage, or lymphadenopathy. ABDOMEN: Soft,  no distention noted.  No tenderness, rebound or guarding.  PELVIC: Normal appearing external genitalia; Mildly atrophic vaginal mucosa and cervix. No abnormal discharge noted.  Pap smear obtained.  Normal uterine size, no other palpable masses, no uterine or adnexal tenderness. Chaperone present for exam. IUD strings present on exam MUSCULOSKELETAL: Normal range of motion.    Assessment and Plan:  1) Annual gynecologic examination with pap smear:  Will follow up results of pap smear and manage accordingly. STI screening desired No.  Routine preventative health maintenance measures emphasized. Reviewed perimenopausal symptoms and management.    1. Well woman exam with routine gynecological exam (Primary) - Cytology - PAP( [facility]) - MM 3D SCREENING MAMMOGRAM BILATERAL BREAST; Future - No menopausal sx currently  - Reviewed removal of IUD in 2-3 years to assure she has gone though menopause. Not having any sx currently of menopause. Check in about sx in 1 year.    Please refer to After Visit Summary for other counseling recommendations.   Return in about 1 year (around 08/21/2024) for Yearly wellness exam.  Suzen Maryan Masters, MD, MPH, ABFM Attending Physician Center for [facility]

## 2023-08-29 LAB — CYTOLOGY - PAP
Comment: NEGATIVE
Diagnosis: UNDETERMINED — AB
High risk HPV: NEGATIVE

## 2023-09-06 ENCOUNTER — Encounter: Payer: Self-pay | Admitting: Family Medicine

## 2023-09-06 ENCOUNTER — Telehealth: Payer: Self-pay

## 2023-09-06 NOTE — Telephone Encounter (Signed)
 Patient aware of pap results. Arrie Lares, RN

## 2023-09-27 ENCOUNTER — Ambulatory Visit: Payer: BC Managed Care – PPO | Admitting: Family Medicine

## 2023-10-03 ENCOUNTER — Ambulatory Visit: Payer: BC Managed Care – PPO | Admitting: Family Medicine

## 2023-11-03 ENCOUNTER — Ambulatory Visit: Admitting: Family Medicine

## 2023-11-03 ENCOUNTER — Encounter: Payer: Self-pay | Admitting: Family Medicine

## 2023-11-03 VITALS — BP 139/85 | HR 82 | Temp 98.1°F | Ht 60.0 in | Wt 154.0 lb

## 2023-11-03 DIAGNOSIS — Z23 Encounter for immunization: Secondary | ICD-10-CM | POA: Diagnosis not present

## 2023-11-03 DIAGNOSIS — R051 Acute cough: Secondary | ICD-10-CM | POA: Diagnosis not present

## 2023-11-03 LAB — CBC WITH DIFFERENTIAL/PLATELET
Basophils Absolute: 0 10*3/uL (ref 0.0–0.1)
Basophils Relative: 0.3 % (ref 0.0–3.0)
Eosinophils Absolute: 0 10*3/uL (ref 0.0–0.7)
Eosinophils Relative: 0.6 % (ref 0.0–5.0)
HCT: 41 % (ref 36.0–46.0)
Hemoglobin: 13.6 g/dL (ref 12.0–15.0)
Lymphocytes Relative: 38 % (ref 12.0–46.0)
Lymphs Abs: 1.8 10*3/uL (ref 0.7–4.0)
MCHC: 33.3 g/dL (ref 30.0–36.0)
MCV: 84 fl (ref 78.0–100.0)
Monocytes Absolute: 0.5 10*3/uL (ref 0.1–1.0)
Monocytes Relative: 10.2 % (ref 3.0–12.0)
Neutro Abs: 2.4 10*3/uL (ref 1.4–7.7)
Neutrophils Relative %: 50.9 % (ref 43.0–77.0)
Platelets: 294 10*3/uL (ref 150.0–400.0)
RBC: 4.89 Mil/uL (ref 3.87–5.11)
RDW: 12.8 % (ref 11.5–15.5)
WBC: 4.8 10*3/uL (ref 4.0–10.5)

## 2023-11-03 MED ORDER — BENZONATATE 200 MG PO CAPS
200.0000 mg | ORAL_CAPSULE | Freq: Three times a day (TID) | ORAL | 0 refills | Status: DC | PRN
Start: 2023-11-03 — End: 2023-12-20

## 2023-11-03 NOTE — Progress Notes (Signed)
 Acute Office Visit  Subjective:     Patient ID: Rebekah Knox, female    DOB: [birth year], 53 y.o.   MRN: [id number]  Chief Complaint  Patient presents with   Cough    HPI Patient is in today for cough.    Discussed the use of AI scribe software for clinical note transcription with the patient, who gave verbal consent to proceed.  History of Present Illness Rebekah Knox "Rebekah Knox" is a 53 year old female who presents with persistent cough and upper respiratory symptoms.  She has been experiencing symptoms consistent with an upper respiratory infection for the past ten days. Her symptoms began after returning from a trip to [place], where she was exposed to hot weather, and then arriving in the [place] where it was cold. Initially, she had cold-like symptoms including rhinorrhea, cough, sneezing, and headaches, along with sinus pressure and a fever for the first two days. She completed a full course of amoxicillin and has been taking Theraflu for about a week, which has improved her sinus pressure and headaches. However, she continues to have a persistent cough, which is productive with white, thick sputum. The cough is more prominent during the day and less so at night, allowing her to sleep well. No hemoptysis, dyspnea, or wheezing.  She has not taken a flu or COVID test during this illness. She regularly receives the influenza vaccine but missed it this year due to her travel schedule. She is planning to travel to [place] on March 23rd and is concerned about clearing her symptoms before the trip. She would like to go ahead and get her flu shot today since she is afebrile.          ROS All review of systems negative except what is listed in the HPI      Objective:    BP 139/85   Pulse 82   Temp 98.1 F (36.7 C) (Oral)   Ht 5' (1.524 m)   Wt 154 lb (69.9 kg)   SpO2 98%   BMI 30.08 kg/m    Physical Exam Vitals reviewed.  Constitutional:      Appearance: Normal  appearance.  HENT:     Head: Normocephalic and atraumatic.     Right Ear: Tympanic membrane normal.     Left Ear: Tympanic membrane normal.     Nose: Nose normal.     Mouth/Throat:     Mouth: Mucous membranes are moist.     Pharynx: Oropharynx is clear.  Cardiovascular:     Rate and Rhythm: Normal rate and regular rhythm.  Pulmonary:     Effort: Pulmonary effort is normal.     Breath sounds: Normal breath sounds. No wheezing, rhonchi or rales.  Skin:    General: Skin is warm and dry.  Neurological:     Mental Status: She is alert and oriented to person, place, and time.  Psychiatric:        Mood and Affect: Mood normal.        Behavior: Behavior normal.        Thought Content: Thought content normal.        Judgment: Judgment normal.     No results found for any visits on 11/03/23.      Assessment & Plan:   Problem List Items Addressed This Visit   None Visit Diagnoses       Acute cough    -  Primary   Relevant Medications   benzonatate (TESSALON) 200 MG capsule  Other Relevant Orders   CBC with Differential/Platelet     Flu vaccine need       Relevant Orders   Flu vaccine trivalent PF, 6mos and older(Flulaval,Afluria,Fluarix,Fluzone) (Completed)       Assessment & Plan Upper Respiratory Infection Symptoms suggestive of upper respiratory infection post-travel. Persistent cough despite 1-week amoxicillin (sinus symptoms improved). No fever or dyspnea.  - Order complete blood count at patient request - Prescribe Tessalon - Recommend Mucinex twice daily. - Advise humidifier use at night.  Continue supportive measures including rest, hydration, humidifier use, steam showers, warm compresses to sinuses, warm liquids with lemon and honey, and over-the-counter cough, cold, and analgesics as needed.     Meds ordered this encounter  Medications   benzonatate (TESSALON) 200 MG capsule    Sig: Take 1 capsule (200 mg total) by mouth 3 (three) times daily as needed  for cough.    Dispense:  30 capsule    Refill:  0    Supervising Provider:   Danise Edge A [[id number]    Return if symptoms worsen or fail to improve.  Clayborne Dana, NP

## 2023-12-21 ENCOUNTER — Encounter (HOSPITAL_BASED_OUTPATIENT_CLINIC_OR_DEPARTMENT_OTHER): Payer: Self-pay

## 2023-12-21 ENCOUNTER — Encounter: Payer: Self-pay | Admitting: Family Medicine

## 2023-12-21 ENCOUNTER — Ambulatory Visit (HOSPITAL_BASED_OUTPATIENT_CLINIC_OR_DEPARTMENT_OTHER)
Admission: RE | Admit: 2023-12-21 | Discharge: 2023-12-21 | Disposition: A | Discharge status: Home / Self Care | Source: Ambulatory Visit | Attending: Family Medicine | Admitting: Family Medicine

## 2023-12-21 ENCOUNTER — Ambulatory Visit: Payer: BC Managed Care – PPO | Admitting: Family Medicine

## 2023-12-21 VITALS — BP 118/79 | HR 70 | Temp 98.2°F | Resp 16 | Ht <= 58 in | Wt 155.0 lb

## 2023-12-21 DIAGNOSIS — E559 Vitamin D deficiency, unspecified: Secondary | ICD-10-CM

## 2023-12-21 DIAGNOSIS — E538 Deficiency of other specified B group vitamins: Secondary | ICD-10-CM | POA: Diagnosis not present

## 2023-12-21 DIAGNOSIS — Z1159 Encounter for screening for other viral diseases: Secondary | ICD-10-CM

## 2023-12-21 DIAGNOSIS — Z01419 Encounter for gynecological examination (general) (routine) without abnormal findings: Secondary | ICD-10-CM

## 2023-12-21 DIAGNOSIS — Z0000 Encounter for general adult medical examination without abnormal findings: Secondary | ICD-10-CM

## 2023-12-21 DIAGNOSIS — Z1231 Encounter for screening mammogram for malignant neoplasm of breast: Secondary | ICD-10-CM | POA: Insufficient documentation

## 2023-12-21 LAB — B12 AND FOLATE PANEL
Folate: 22.3 ng/mL (ref >5.9)
Vitamin B-12: 1259 pg/mL — ABNORMAL HIGH (ref 211–911)

## 2023-12-21 LAB — VITAMIN D 25 HYDROXY (VIT D DEFICIENCY, FRACTURES): VITD: 34.79 ng/mL (ref 30.00–100.00)

## 2023-12-21 NOTE — Progress Notes (Signed)
 Complete physical exam  Patient: Rebekah Knox   DOB: 11/17/1970   53 y.o. Female  MRN: [id number]  Subjective:    Chief Complaint  Patient presents with   Annual Exam    Rebekah Knox is a 53 y.o. female who presents today for a complete physical exam. She reports consuming a general diet. Home exercise routine includes walking. She generally feels well. She reports sleeping well. She does not have additional problems to discuss today.   Currently lives with: husband Acute concerns or interim problems since last visit: no  Vision concerns: no Dental concerns: no STD concerns: no  ETOH use: no Nicotine use: no Recreational drugs/illegal substances: no   Females:  She is currently  sexually active  Contraception choices are: IUD LMP: ~ 12/14/23      Most recent fall risk assessment:    12/21/2023   10:12 AM  Fall Risk   Falls in the past year? 0  Number falls in past yr: 0  Injury with Fall? 0  Risk for fall due to : No Fall Risks  Follow up Falls evaluation completed     Most recent depression screenings:    12/21/2023   10:12 AM 08/22/2023    2:05 PM  PHQ 2/9 Scores  PHQ - 2 Score 0 0  PHQ- 9 Score  0            Patient Care Team: Everlina Hock, NP as PCP - General (Family Medicine)   Outpatient Medications Prior to Visit  Medication Sig   amLODipine  (NORVASC ) 5 MG tablet Take 1 tablet (5 mg total) by mouth daily.   Cholecalciferol (VITAMIN D3) 50 MCG (2000 UT) capsule Take 2,000 Units by mouth daily.   cyanocobalamin  (VITAMIN B12) 1000 MCG tablet Take 1,000 mcg by mouth daily.   levothyroxine  (SYNTHROID ) 100 MCG tablet Take 1 tablet (100 mcg total) by mouth daily.   Multiple Vitamins-Minerals (WOMENS MULTIVITAMIN PO) Take by mouth daily.   simvastatin  (ZOCOR ) 20 MG tablet Take 1 tablet (20 mg total) by mouth at bedtime.   telmisartan  (MICARDIS ) 40 MG tablet Take 1 tablet (40 mg total) by mouth daily.   [DISCONTINUED] benzonatate   (TESSALON ) 200 MG capsule Take 1 capsule (200 mg total) by mouth 3 (three) times daily as needed for cough.   No facility-administered medications prior to visit.    ROS All review of systems negative except what is listed in the HPI        Objective:     BP 118/79 (BP Location: Right Arm, Patient Position: Sitting, Cuff Size: Normal)   Pulse 70   Temp 98.2 F (36.8 C) (Oral)   Resp 16   Ht 4\' 10"  (1.473 m)   Wt 155 lb (70.3 kg)   SpO2 100%   BMI 32.40 kg/m    Physical Exam Vitals reviewed.  Constitutional:      General: She is not in acute distress.    Appearance: Normal appearance. She is not ill-appearing.  HENT:     Head: Normocephalic and atraumatic.     Right Ear: Tympanic membrane normal.     Left Ear: Tympanic membrane normal.     Nose: Nose normal.     Mouth/Throat:     Mouth: Mucous membranes are moist.     Pharynx: Oropharynx is clear.  Eyes:     Extraocular Movements: Extraocular movements intact.     Conjunctiva/sclera: Conjunctivae normal.     Pupils: Pupils are equal, round, and reactive  to light.  Cardiovascular:     Rate and Rhythm: Normal rate and regular rhythm.     Pulses: Normal pulses.     Heart sounds: Normal heart sounds.  Pulmonary:     Effort: Pulmonary effort is normal.     Breath sounds: Normal breath sounds.  Abdominal:     General: Abdomen is flat. Bowel sounds are normal. There is no distension.     Palpations: Abdomen is soft. There is no mass.     Tenderness: There is no abdominal tenderness. There is no right CVA tenderness, left CVA tenderness, guarding or rebound.  Genitourinary:    Comments: Deferred exam Musculoskeletal:        General: Normal range of motion.     Cervical back: Normal range of motion and neck supple. No tenderness.     Right lower leg: No edema.     Left lower leg: No edema.  Lymphadenopathy:     Cervical: No cervical adenopathy.  Skin:    General: Skin is warm and dry.     Capillary Refill:  Capillary refill takes less than 2 seconds.  Neurological:     General: No focal deficit present.     Mental Status: She is alert and oriented to person, place, and time. Mental status is at baseline.  Psychiatric:        Mood and Affect: Mood normal.        Behavior: Behavior normal.        Thought Content: Thought content normal.        Judgment: Judgment normal.      No results found for any visits on 12/21/23.     Assessment & Plan:    Routine Health Maintenance and Physical Exam Discussed health promotion and safety including diet and exercise recommendations, dental health, and injury prevention. Tobacco cessation if applicable. Seat belts, sunscreen, smoke detectors, etc.    Immunization History  Administered Date(s) Administered   Influenza, Seasonal, Injecte, Preservative Fre 06/21/2023, 11/03/2023   Influenza,inj,Quad PF,6+ Mos 08/19/2014, 05/04/2016, 06/20/2017, 05/31/2018, 06/24/2019, 04/16/2020, 07/20/2021   Influenza-Unspecified 08/19/2014, 05/04/2016, 06/20/2017, 05/31/2018, 06/24/2019   PFIZER(Purple Top)SARS-COV-2 Vaccination 10/31/2019, 11/21/2019, 06/15/2020   Tdap 05/31/2018    Health Maintenance  Topic Date Due   Hepatitis C Screening  Never done   Zoster Vaccines- Shingrix (1 of 2) Never done   MAMMOGRAM  12/20/2023   COVID-19 Vaccine (4 - 2024-25 season) 06/19/2024 (Originally 04/23/2023)   INFLUENZA VACCINE  03/22/2024   Colonoscopy  03/02/2026   DTaP/Tdap/Td (2 - Td or Tdap) 05/31/2028   Cervical Cancer Screening (HPV/Pap Cotest)  08/21/2028   HIV Screening  Completed   HPV VACCINES  Aged Out   Meningococcal B Vaccine  Aged Out        Problem List Items Addressed This Visit       Active Problems   B12 deficiency (Chronic)   Relevant Orders   B12 and Folate Panel   Vitamin D  deficiency (Chronic)   Relevant Orders   VITAMIN D  25 Hydroxy (Vit-D Deficiency, Fractures)   Other Visit Diagnoses       Annual physical exam    -  Primary Endo  is following - routine labs stable in the past few months and seeing Endo again in June. Will defer routine labs and just recheck the above listed.       Encounter for hepatitis C screening test for low risk patient       Relevant Orders   Hepatitis C  antibody        PATIENT COUNSELING:   Advised to take 1 mg of folate supplement per day if capable of pregnancy.   Sexuality: Discussed sexually transmitted diseases, partner selection, use of condoms, avoidance of unintended pregnancy, and contraceptive alternatives.   I discussed with the patient that most people either abstain from alcohol or drink within safe limits (<=14/week and <=4 drinks/occasion for males, <=7/weeks and <= 3 drinks/occasion for females) and that the risk for alcohol disorders and other health effects rises proportionally with the number of drinks per week and how often a drinker exceeds daily limits.  Discussed cessation/primary prevention of drug use and availability of treatment for abuse.   Diet: Encouraged to adjust caloric intake to maintain or achieve ideal body weight, to reduce intake of dietary saturated fat and total fat, to limit sodium intake by avoiding high sodium foods and not adding table salt, and to maintain adequate dietary potassium and calcium preferably from fresh fruits, vegetables, and low-fat dairy products. Encouraged vitamin D  1000 units and Calcium 1300mg  or 4 servings of dairy a day.  Emphasized the importance of regular exercise.  Injury prevention: Discussed safety belts, safety helmets, smoke detector, smoking near bedding or upholstery.   Dental health: Discussed importance of regular tooth brushing, flossing, and dental visits.       Return in about 1 year (around 12/20/2024) for physical.     Everlina Hock, NP

## 2023-12-22 ENCOUNTER — Encounter: Payer: Self-pay | Admitting: Family Medicine

## 2023-12-22 LAB — HEPATITIS C ANTIBODY: Hepatitis C Ab: NONREACTIVE

## 2023-12-26 ENCOUNTER — Encounter: Payer: Self-pay | Admitting: Family Medicine

## 2023-12-28 ENCOUNTER — Ambulatory Visit (HOSPITAL_BASED_OUTPATIENT_CLINIC_OR_DEPARTMENT_OTHER)

## 2024-06-26 ENCOUNTER — Ambulatory Visit: Admitting: Family Medicine

## 2024-06-30 NOTE — Progress Notes (Signed)
 Acute Office Visit  Subjective:  Patient ID: Rebekah Knox, female    DOB: 1970/09/08  Age: 53 y.o. MRN: [id number]  CC: No chief complaint on file.     HPI Rebekah Knox is here for Back and Knee Pain and Abdominal Pain.   Past Medical History:  Diagnosis Date   Dysmenorrhea    Hyperlipidemia    Hypertension    Thyroid  disease     Past Surgical History:  Procedure Laterality Date   EXCISION METACARPAL MASS Right 08/06/2018   Procedure: EXCISION OF INDEX FINGER MASS;  Surgeon: Sebastian Lenis, MD;  Location: [facility];  Service: Orthopedics;  Laterality: Right;  LENGTH: 30 MINS   WISDOM TOOTH EXTRACTION      Family History  Problem Relation Age of Onset   Cancer Mother        lung   Heart attack Father    Diabetes Father    Hyperlipidemia Father    Colon cancer Neg Hx    Colon polyps Neg Hx    Esophageal cancer Neg Hx    Rectal cancer Neg Hx    Stomach cancer Neg Hx     Social History   Socioeconomic History   Marital status: Married    Spouse name: Not on file   Number of children: Not on file   Years of education: Not on file   Highest education level: Not on file  Occupational History   Not on file  Tobacco Use   Smoking status: Never   Smokeless tobacco: Never  Vaping Use   Vaping status: Never Used  Substance and Sexual Activity   Alcohol use: No    Alcohol/week: 0.0 standard drinks of alcohol   Drug use: No   Sexual activity: Yes    Partners: Male    Birth control/protection: I.U.D.  Other Topics Concern   Not on file  Social History Narrative   Married since 1995   Lives in house, two stories, 6 persons live in home, no pets   No exercise    Social Drivers of Health   Financial Resource Strain: Not on file  Food Insecurity: Not on file  Transportation Needs: Not on file  Physical Activity: Unknown (07/20/2021)   Received from [facility] visits prior to 10/22/2022.   Exercise Vital Sign     On average, how many days per week do you engage in moderate to strenuous exercise (like a brisk walk)?: 7 days    Minutes of Exercise per Session: Not on file  Stress: Stress Concern Present (07/20/2021)   Received from [facility] visits prior to 10/22/2022.   [employer] of Occupational Health - Occupational Stress Questionnaire    Feeling of Stress : To some extent  Social Connections: Unknown (07/20/2021)   Received from [facility] visits prior to 10/22/2022.   Social Connection and Isolation Panel    In a typical week, how many times do you talk on the phone with family, friends, or neighbors?: More than three times a week    How often do you get together with friends or relatives?: More than three times a week    How often do you attend church or religious services?: More than 4 times per year    Active Member of Clubs or Organizations: Not on file    Attends Banker Meetings: Not on file    Marital Status: Not on file  Intimate Partner  Violence: Not on file    ROS All ROS negative except what is listed in the HPI.   Objective:   Today's Vitals: There were no vitals taken for this visit.  Physical Exam  Assessment & Plan:   Problem List Items Addressed This Visit   None     Follow-up: No follow-ups on file.   Waddell FURY Almarie, DNP, FNP-C  I,Emily Lagle,acting as a neurosurgeon for Waddell KATHEE Almarie, NP.,have documented all relevant documentation on the behalf of Waddell KATHEE Almarie, NP.   I, Waddell KATHEE Almarie, NP, have reviewed all documentation for this visit. The documentation on 07/01/2024 for the exam, diagnosis, procedures, and orders are all accurate and complete.

## 2024-07-01 ENCOUNTER — Ambulatory Visit (HOSPITAL_BASED_OUTPATIENT_CLINIC_OR_DEPARTMENT_OTHER)
Admission: RE | Admit: 2024-07-01 | Discharge: 2024-07-01 | Disposition: A | Discharge status: Home / Self Care | Source: Ambulatory Visit | Attending: Family Medicine | Admitting: Family Medicine

## 2024-07-01 ENCOUNTER — Ambulatory Visit: Admitting: Family Medicine

## 2024-07-01 ENCOUNTER — Encounter: Payer: Self-pay | Admitting: Family Medicine

## 2024-07-01 VITALS — BP 117/87 | HR 77 | Temp 97.7°F | Resp 16 | Ht <= 58 in | Wt 163.8 lb

## 2024-07-01 DIAGNOSIS — M545 Low back pain, unspecified: Secondary | ICD-10-CM | POA: Insufficient documentation

## 2024-07-01 DIAGNOSIS — I10 Essential (primary) hypertension: Secondary | ICD-10-CM

## 2024-07-01 DIAGNOSIS — K649 Unspecified hemorrhoids: Secondary | ICD-10-CM

## 2024-07-01 DIAGNOSIS — Z23 Encounter for immunization: Secondary | ICD-10-CM

## 2024-07-01 DIAGNOSIS — M25561 Pain in right knee: Secondary | ICD-10-CM

## 2024-07-01 DIAGNOSIS — M25562 Pain in left knee: Secondary | ICD-10-CM

## 2024-07-01 MED ORDER — HYDROCORTISONE (PERIANAL) 2.5 % EX CREA: 1.0000 | TOPICAL_CREAM | Freq: Two times a day (BID) | CUTANEOUS | 0 refills | Status: AC

## 2024-07-01 MED ORDER — HYDROCORTISONE ACETATE 25 MG RE SUPP: 25.0000 mg | Freq: Two times a day (BID) | RECTAL | 0 refills | Status: AC

## 2024-07-01 MED ORDER — LEVOTHYROXINE SODIUM 100 MCG PO TABS: 100.0000 ug | ORAL_TABLET | Freq: Every day | ORAL | 1 refills | Status: AC

## 2024-07-01 MED ORDER — CYCLOBENZAPRINE HCL 5 MG PO TABS: 5.0000 mg | ORAL_TABLET | Freq: Three times a day (TID) | ORAL | 1 refills | Status: AC | PRN

## 2024-07-01 MED ORDER — ROSUVASTATIN CALCIUM 20 MG PO TABS: 20.0000 mg | ORAL_TABLET | Freq: Every day | ORAL | 1 refills | Status: AC

## 2024-07-01 MED ORDER — MELOXICAM 7.5 MG PO TABS: 7.5000 mg | ORAL_TABLET | Freq: Every day | ORAL | 0 refills | Status: AC

## 2024-07-01 MED ORDER — AMLODIPINE BESYLATE 5 MG PO TABS: 5.0000 mg | ORAL_TABLET | Freq: Every day | ORAL | 1 refills | Status: AC

## 2024-07-01 NOTE — Patient Instructions (Addendum)
 Rectal pain/bulge: You did have hemorrhoids on exam today. I will send in cream for the external hemorrhoid and a suppository for the internal ones. Start with the suppository for a few days first since that was the most severe. Your in-office sample today did have some blood in the stool, likely from the hemorrhoid, but given your history of precancerous polyps, go ahead and schedule a follow-up with your GI doctor.   Avoid prolonged sitting.  Avoid straining with bowel movements.  Increase high fiber food intake.   Participate in regular exercise. Bulk forming agents: Metamucil, Citrucel, Fibercon Stool softeners: Colace Irritation/Itching relief: OTC Anusol, Preparation H, hydrocortisone suppositories, warm sitz baths

## 2024-07-04 ENCOUNTER — Ambulatory Visit: Payer: Self-pay | Admitting: Family Medicine

## 2024-12-23 ENCOUNTER — Encounter: Admitting: Family Medicine
